# Patient Record
Sex: Male | Born: 1995 | Race: Black or African American | Hispanic: No | Marital: Single | Smoking: Never smoker
Health system: Southern US, Community
[De-identification: ages and names within clinical notes are randomized; demographics above are authoritative.]

## PROBLEM LIST (undated history)

## (undated) DIAGNOSIS — K589 Irritable bowel syndrome without diarrhea: Secondary | ICD-10-CM

## (undated) DIAGNOSIS — T7840XA Allergy, unspecified, initial encounter: Secondary | ICD-10-CM

## (undated) DIAGNOSIS — K2970 Gastritis, unspecified, without bleeding: Secondary | ICD-10-CM

## (undated) HISTORY — PX: KNEE ARTHROSCOPY WITH ANTERIOR CRUCIATE LIGAMENT (ACL) REPAIR: SHX5644

## (undated) HISTORY — DX: Allergy, unspecified, initial encounter: T78.40XA

## (undated) HISTORY — DX: Irritable bowel syndrome, unspecified: K58.9

## (undated) HISTORY — DX: Gastritis, unspecified, without bleeding: K29.70

---

## 2018-03-01 ENCOUNTER — Observation Stay (HOSPITAL_COMMUNITY): Payer: BLUE CROSS/BLUE SHIELD | Admitting: Certified Registered"

## 2018-03-01 ENCOUNTER — Encounter (HOSPITAL_COMMUNITY): Payer: Self-pay | Admitting: Emergency Medicine

## 2018-03-01 ENCOUNTER — Emergency Department (HOSPITAL_COMMUNITY): Payer: BLUE CROSS/BLUE SHIELD

## 2018-03-01 ENCOUNTER — Encounter (HOSPITAL_COMMUNITY)
Admission: EM | Disposition: A | Discharge status: Home / Self Care | Payer: Self-pay | Source: Home / Self Care | Attending: Emergency Medicine

## 2018-03-01 ENCOUNTER — Other Ambulatory Visit: Payer: Self-pay

## 2018-03-01 ENCOUNTER — Observation Stay (HOSPITAL_COMMUNITY)
Admission: EM | Admit: 2018-03-01 | Discharge: 2018-03-02 | Disposition: A | Discharge status: Home / Self Care | Payer: BLUE CROSS/BLUE SHIELD | Attending: Surgery | Admitting: Surgery

## 2018-03-01 DIAGNOSIS — K3533 Acute appendicitis with perforation and localized peritonitis, with abscess: Principal | ICD-10-CM | POA: Insufficient documentation

## 2018-03-01 DIAGNOSIS — K3580 Unspecified acute appendicitis: Secondary | ICD-10-CM | POA: Diagnosis present

## 2018-03-01 HISTORY — PX: LAPAROSCOPIC APPENDECTOMY: SHX408

## 2018-03-01 LAB — COMPREHENSIVE METABOLIC PANEL
ALBUMIN: 4.2 g/dL (ref 3.5–5.0)
ALT: 18 U/L (ref 0–44)
ANION GAP: 10 (ref 5–15)
AST: 17 U/L (ref 15–41)
Alkaline Phosphatase: 70 U/L (ref 38–126)
BUN: 6 mg/dL (ref 6–20)
CALCIUM: 9.7 mg/dL (ref 8.9–10.3)
CHLORIDE: 104 mmol/L (ref 98–111)
CO2: 27 mmol/L (ref 22–32)
Creatinine, Ser: 1.26 mg/dL — ABNORMAL HIGH (ref 0.61–1.24)
GFR calc Af Amer: >60 mL/min (ref >60)
Glucose, Bld: 113 mg/dL — ABNORMAL HIGH (ref 70–99)
POTASSIUM: 3.6 mmol/L (ref 3.5–5.1)
Sodium: 141 mmol/L (ref 135–145)
TOTAL PROTEIN: 7.2 g/dL (ref 6.5–8.1)
Total Bilirubin: 1.1 mg/dL (ref 0.3–1.2)

## 2018-03-01 LAB — CBC
HCT: 45.4 % (ref 39.0–52.0)
Hemoglobin: 14.5 g/dL (ref 13.0–17.0)
MCH: 29.1 pg (ref 26.0–34.0)
MCHC: 31.9 g/dL (ref 30.0–36.0)
MCV: 91 fL (ref 78.0–100.0)
Platelets: 308 10*3/uL (ref 150–400)
RBC: 4.99 MIL/uL (ref 4.22–5.81)
RDW: 13.1 % (ref 11.5–15.5)
WBC: 10.3 10*3/uL (ref 4.0–10.5)

## 2018-03-01 LAB — URINALYSIS, ROUTINE W REFLEX MICROSCOPIC
BILIRUBIN URINE: NEGATIVE
Glucose, UA: NEGATIVE mg/dL
Hgb urine dipstick: NEGATIVE
KETONES UR: NEGATIVE mg/dL
Leukocytes, UA: NEGATIVE
NITRITE: NEGATIVE
PROTEIN: NEGATIVE mg/dL
Specific Gravity, Urine: 1.011 (ref 1.005–1.030)
pH: 7 (ref 5.0–8.0)

## 2018-03-01 LAB — LIPASE, BLOOD: LIPASE: 40 U/L (ref 11–51)

## 2018-03-01 SURGERY — APPENDECTOMY, LAPAROSCOPIC
Anesthesia: General | Site: Abdomen

## 2018-03-01 MED ORDER — ROCURONIUM BROMIDE 50 MG/5ML IV SOSY
PREFILLED_SYRINGE | INTRAVENOUS | Status: AC
  Filled 2018-03-01: qty 5

## 2018-03-01 MED ORDER — FENTANYL CITRATE (PF) 100 MCG/2ML IJ SOLN: 25.0000 ug | INTRAMUSCULAR | Status: DC | PRN

## 2018-03-01 MED ORDER — BUPIVACAINE-EPINEPHRINE 0.5% -1:200000 IJ SOLN
INTRAMUSCULAR | Status: AC
  Filled 2018-03-01: qty 1

## 2018-03-01 MED ORDER — LIDOCAINE HCL (CARDIAC) PF 100 MG/5ML IV SOSY
PREFILLED_SYRINGE | INTRAVENOUS | Status: DC | PRN
  Administered 2018-03-01: 100 mg via INTRATRACHEAL

## 2018-03-01 MED ORDER — FENTANYL CITRATE (PF) 100 MCG/2ML IJ SOLN
50.0000 ug | Freq: Once | INTRAMUSCULAR | Status: AC
  Administered 2018-03-01: 50 ug via INTRAVENOUS
  Filled 2018-03-01: qty 2

## 2018-03-01 MED ORDER — OXYCODONE HCL 5 MG PO TABS
5.0000 mg | ORAL_TABLET | ORAL | Status: DC | PRN
  Administered 2018-03-01 – 2018-03-02 (×2): 5 mg via ORAL
  Filled 2018-03-01 (×2): qty 1

## 2018-03-01 MED ORDER — KETOROLAC TROMETHAMINE 30 MG/ML IJ SOLN: 30.0000 mg | Freq: Four times a day (QID) | INTRAMUSCULAR | Status: DC | PRN

## 2018-03-01 MED ORDER — ONDANSETRON HCL 4 MG/2ML IJ SOLN
INTRAMUSCULAR | Status: DC | PRN
  Administered 2018-03-01: 4 mg via INTRAVENOUS

## 2018-03-01 MED ORDER — DEXTROSE-NACL 5-0.9 % IV SOLN
INTRAVENOUS | Status: DC
  Administered 2018-03-01 – 2018-03-02 (×2): via INTRAVENOUS

## 2018-03-01 MED ORDER — HYDROMORPHONE HCL 1 MG/ML IJ SOLN: 0.5000 mg | INTRAMUSCULAR | Status: DC | PRN

## 2018-03-01 MED ORDER — KETOROLAC TROMETHAMINE 30 MG/ML IJ SOLN
INTRAMUSCULAR | Status: AC
  Filled 2018-03-01: qty 1

## 2018-03-01 MED ORDER — SUFENTANIL CITRATE 50 MCG/ML IV SOLN
INTRAVENOUS | Status: DC | PRN
  Administered 2018-03-01 (×2): 10 ug via INTRAVENOUS

## 2018-03-01 MED ORDER — GLYCOPYRROLATE PF 0.2 MG/ML IJ SOSY
PREFILLED_SYRINGE | INTRAMUSCULAR | Status: AC
  Filled 2018-03-01: qty 1

## 2018-03-01 MED ORDER — SUFENTANIL CITRATE 50 MCG/ML IV SOLN
INTRAVENOUS | Status: AC
  Filled 2018-03-01: qty 1

## 2018-03-01 MED ORDER — DEXAMETHASONE SODIUM PHOSPHATE 10 MG/ML IJ SOLN
INTRAMUSCULAR | Status: DC | PRN
  Administered 2018-03-01: 10 mg via INTRAVENOUS

## 2018-03-01 MED ORDER — ONDANSETRON HCL 4 MG/2ML IJ SOLN
INTRAMUSCULAR | Status: AC
  Filled 2018-03-01: qty 2

## 2018-03-01 MED ORDER — ACETAMINOPHEN 10 MG/ML IV SOLN: 1000.0000 mg | Freq: Once | INTRAVENOUS | Status: DC | PRN

## 2018-03-01 MED ORDER — SODIUM CHLORIDE 0.9 % IR SOLN
Status: DC | PRN
  Administered 2018-03-01: 1000 mL

## 2018-03-01 MED ORDER — CIPROFLOXACIN IN D5W 400 MG/200ML IV SOLN
400.0000 mg | Freq: Once | INTRAVENOUS | Status: AC
  Administered 2018-03-01: 400 mg via INTRAVENOUS
  Filled 2018-03-01: qty 200

## 2018-03-01 MED ORDER — OXYCODONE HCL 5 MG PO TABS: 5.0000 mg | ORAL_TABLET | Freq: Once | ORAL | Status: DC | PRN

## 2018-03-01 MED ORDER — BUPIVACAINE-EPINEPHRINE 0.5% -1:200000 IJ SOLN
INTRAMUSCULAR | Status: DC | PRN
  Administered 2018-03-01: 20 mL

## 2018-03-01 MED ORDER — METRONIDAZOLE IN NACL 5-0.79 MG/ML-% IV SOLN
500.0000 mg | Freq: Once | INTRAVENOUS | Status: AC
  Administered 2018-03-01: 500 mg via INTRAVENOUS

## 2018-03-01 MED ORDER — DEXAMETHASONE SODIUM PHOSPHATE 10 MG/ML IJ SOLN
INTRAMUSCULAR | Status: AC
  Filled 2018-03-01: qty 1

## 2018-03-01 MED ORDER — OXYCODONE HCL 5 MG/5ML PO SOLN: 5.0000 mg | Freq: Once | ORAL | Status: DC | PRN

## 2018-03-01 MED ORDER — GLYCOPYRROLATE 0.2 MG/ML IJ SOLN
INTRAMUSCULAR | Status: DC | PRN
  Administered 2018-03-01: 0.2 mg via INTRAVENOUS

## 2018-03-01 MED ORDER — MIDAZOLAM HCL 2 MG/2ML IJ SOLN
INTRAMUSCULAR | Status: AC
  Filled 2018-03-01: qty 2

## 2018-03-01 MED ORDER — SUGAMMADEX SODIUM 200 MG/2ML IV SOLN
INTRAVENOUS | Status: DC | PRN
  Administered 2018-03-01: 200 mg via INTRAVENOUS

## 2018-03-01 MED ORDER — DEXTROSE-NACL 5-0.9 % IV SOLN: INTRAVENOUS | Status: DC

## 2018-03-01 MED ORDER — KETOROLAC TROMETHAMINE 30 MG/ML IJ SOLN
INTRAMUSCULAR | Status: DC | PRN
  Administered 2018-03-01: 30 mg via INTRAVENOUS

## 2018-03-01 MED ORDER — ARTIFICIAL TEARS OPHTHALMIC OINT
TOPICAL_OINTMENT | OPHTHALMIC | Status: AC
  Filled 2018-03-01: qty 3.5

## 2018-03-01 MED ORDER — MIDAZOLAM HCL 5 MG/5ML IJ SOLN
INTRAMUSCULAR | Status: DC | PRN
  Administered 2018-03-01: 2 mg via INTRAVENOUS

## 2018-03-01 MED ORDER — PROPOFOL 10 MG/ML IV BOLUS
INTRAVENOUS | Status: AC
  Filled 2018-03-01: qty 20

## 2018-03-01 MED ORDER — 0.9 % SODIUM CHLORIDE (POUR BTL) OPTIME
TOPICAL | Status: DC | PRN
  Administered 2018-03-01: 1000 mL

## 2018-03-01 MED ORDER — ROCURONIUM BROMIDE 100 MG/10ML IV SOLN
INTRAVENOUS | Status: DC | PRN
  Administered 2018-03-01: 50 mg via INTRAVENOUS

## 2018-03-01 MED ORDER — ACETAMINOPHEN 160 MG/5ML PO SOLN: 1000.0000 mg | Freq: Once | ORAL | Status: DC | PRN

## 2018-03-01 MED ORDER — PROPOFOL 10 MG/ML IV BOLUS
INTRAVENOUS | Status: DC | PRN
  Administered 2018-03-01: 200 mg via INTRAVENOUS

## 2018-03-01 MED ORDER — LACTATED RINGERS IV SOLN
INTRAVENOUS | Status: DC | PRN
  Administered 2018-03-01: 09:00:00 via INTRAVENOUS

## 2018-03-01 MED ORDER — SODIUM CHLORIDE 0.9 % IJ SOLN
INTRAMUSCULAR | Status: AC
  Filled 2018-03-01: qty 10

## 2018-03-01 MED ORDER — IOHEXOL 300 MG/ML  SOLN
100.0000 mL | Freq: Once | INTRAMUSCULAR | Status: AC | PRN
  Administered 2018-03-01: 100 mL via INTRAVENOUS

## 2018-03-01 MED ORDER — ACETAMINOPHEN 500 MG PO TABS: 1000.0000 mg | ORAL_TABLET | Freq: Once | ORAL | Status: DC | PRN

## 2018-03-01 MED ORDER — HYDROMORPHONE HCL 1 MG/ML IJ SOLN: 1.0000 mg | INTRAMUSCULAR | Status: DC | PRN

## 2018-03-01 MED ORDER — METRONIDAZOLE IN NACL 5-0.79 MG/ML-% IV SOLN
500.0000 mg | Freq: Once | INTRAVENOUS | Status: DC
  Filled 2018-03-01: qty 100

## 2018-03-01 SURGICAL SUPPLY — 39 items
APPLIER CLIP 5 13 M/L LIGAMAX5 (MISCELLANEOUS)
APPLIER CLIP ROT 10 11.4 M/L (STAPLE)
CANISTER SUCT 3000ML PPV (MISCELLANEOUS) ×3 IMPLANT
CHLORAPREP W/TINT 26ML (MISCELLANEOUS) ×3 IMPLANT
CLIP APPLIE 5 13 M/L LIGAMAX5 (MISCELLANEOUS) IMPLANT
CLIP APPLIE ROT 10 11.4 M/L (STAPLE) IMPLANT
COVER SURGICAL LIGHT HANDLE (MISCELLANEOUS) ×3 IMPLANT
COVER WAND RF STERILE (DRAPES) ×3 IMPLANT
CUTTER FLEX LINEAR 45M (STAPLE) ×3 IMPLANT
DERMABOND ADHESIVE PROPEN (GAUZE/BANDAGES/DRESSINGS) ×2
DERMABOND ADVANCED (GAUZE/BANDAGES/DRESSINGS) ×2
DERMABOND ADVANCED .7 DNX12 (GAUZE/BANDAGES/DRESSINGS) ×1 IMPLANT
DERMABOND ADVANCED .7 DNX6 (GAUZE/BANDAGES/DRESSINGS) ×1 IMPLANT
ELECT REM PT RETURN 9FT ADLT (ELECTROSURGICAL) ×3
ELECTRODE REM PT RTRN 9FT ADLT (ELECTROSURGICAL) ×1 IMPLANT
GLOVE SURG SIGNA 7.5 PF LTX (GLOVE) ×3 IMPLANT
GOWN STRL REUS W/ TWL LRG LVL3 (GOWN DISPOSABLE) ×2 IMPLANT
GOWN STRL REUS W/ TWL XL LVL3 (GOWN DISPOSABLE) ×1 IMPLANT
GOWN STRL REUS W/TWL LRG LVL3 (GOWN DISPOSABLE) ×4
GOWN STRL REUS W/TWL XL LVL3 (GOWN DISPOSABLE) ×2
KIT BASIN OR (CUSTOM PROCEDURE TRAY) ×3 IMPLANT
KIT TURNOVER KIT B (KITS) ×3 IMPLANT
NS IRRIG 1000ML POUR BTL (IV SOLUTION) ×3 IMPLANT
PAD ARMBOARD 7.5X6 YLW CONV (MISCELLANEOUS) ×6 IMPLANT
POUCH SPECIMEN RETRIEVAL 10MM (ENDOMECHANICALS) ×3 IMPLANT
RELOAD 45 VASCULAR/THIN (ENDOMECHANICALS) IMPLANT
RELOAD STAPLE TA45 3.5 REG BLU (ENDOMECHANICALS) ×3 IMPLANT
SET IRRIG TUBING LAPAROSCOPIC (IRRIGATION / IRRIGATOR) ×3 IMPLANT
SHEARS HARMONIC ACE PLUS 36CM (ENDOMECHANICALS) ×3 IMPLANT
SLEEVE ENDOPATH XCEL 5M (ENDOMECHANICALS) ×3 IMPLANT
SPECIMEN JAR SMALL (MISCELLANEOUS) ×3 IMPLANT
SUT MON AB 4-0 PC3 18 (SUTURE) ×3 IMPLANT
TOWEL OR 17X24 6PK STRL BLUE (TOWEL DISPOSABLE) ×3 IMPLANT
TOWEL OR 17X26 10 PK STRL BLUE (TOWEL DISPOSABLE) ×3 IMPLANT
TRAY LAPAROSCOPIC MC (CUSTOM PROCEDURE TRAY) ×3 IMPLANT
TROCAR XCEL BLUNT TIP 100MML (ENDOMECHANICALS) ×3 IMPLANT
TROCAR XCEL NON-BLD 5MMX100MML (ENDOMECHANICALS) ×3 IMPLANT
TUBING INSUFFLATION (TUBING) ×3 IMPLANT
WATER STERILE IRR 1000ML POUR (IV SOLUTION) ×3 IMPLANT

## 2018-03-01 NOTE — H&P (Signed)
Chad Akin Yi. is an 22 y.o. male.   Chief Complaint: Abdominal pain HPI: Patient is a 22 year old male who states he began having abdominal pain yesterday morning.  He states that the pain was generalized, periumbilical.  He states that since that time it is localized to the right lower quadrant.  Describes the pain is sharp.  Patient denies any nausea or vomiting or fevers while at home.  Secondary to continued pain he proceeded to the ER.  Upon evaluation the ER he underwent CT scan.  I did review the scan personally.  This did reveal acute appendicitis without perforation.  General surgery was consulted for further evaluation and management.  History reviewed. No pertinent past medical history.  Past Surgical History:  Procedure Laterality Date  . KNEE ARTHROSCOPY WITH ANTERIOR CRUCIATE LIGAMENT (ACL) REPAIR Left     No family history on file. Social History:  reports that he has never smoked. He does not have any smokeless tobacco history on file. He reports that he drinks alcohol. He reports that he does not use drugs.  Allergies:  Allergies  Allergen Reactions  . Amoxicillin Hives     (Not in a hospital admission)  Results for orders placed or performed during the hospital encounter of 03/01/18 (from the past 48 hour(s))  Urinalysis, Routine w reflex microscopic     Status: None   Collection Time: 03/01/18  3:34 AM  Result Value Ref Range   Color, Urine YELLOW YELLOW   APPearance CLEAR CLEAR   Specific Gravity, Urine 1.011 1.005 - 1.030   pH 7.0 5.0 - 8.0   Glucose, UA NEGATIVE NEGATIVE mg/dL   Hgb urine dipstick NEGATIVE NEGATIVE   Bilirubin Urine NEGATIVE NEGATIVE   Ketones, ur NEGATIVE NEGATIVE mg/dL   Protein, ur NEGATIVE NEGATIVE mg/dL   Nitrite NEGATIVE NEGATIVE   Leukocytes, UA NEGATIVE NEGATIVE    Comment: Performed at [facility] [address], [place], [place] [address]  Lipase, blood     Status: None   Collection Time: 03/01/18  3:48 AM   Result Value Ref Range   Lipase 40 11 - 51 U/L    Comment: Performed at [facility], [place] [address], [place],  [address]  Comprehensive metabolic panel     Status: Abnormal   Collection Time: 03/01/18  3:48 AM  Result Value Ref Range   Sodium 141 135 - 145 mmol/L   Potassium 3.6 3.5 - 5.1 mmol/L   Chloride 104 98 - 111 mmol/L   CO2 27 22 - 32 mmol/L   Glucose, Bld 113 (H) 70 - 99 mg/dL   BUN 6 6 - 20 mg/dL   Creatinine, Ser 1.26 (H) 0.61 - 1.24 mg/dL   Calcium 9.7 8.9 - 10.3 mg/dL   Total Protein 7.2 6.5 - 8.1 g/dL   Albumin 4.2 3.5 - 5.0 g/dL   AST 17 15 - 41 U/L   ALT 18 0 - 44 U/L   Alkaline Phosphatase 70 38 - 126 U/L   Total Bilirubin 1.1 0.3 - 1.2 mg/dL   GFR calc non Af Amer >60 >60 mL/min   GFR calc Af Amer >60 >60 mL/min    Comment: (NOTE) The eGFR has been calculated using the CKD EPI equation. This calculation has not been validated in all clinical situations. eGFR's persistently <60 mL/min signify possible Chronic Kidney Disease.    Anion gap 10 5 - 15    Comment: Performed at [facility], [address] [place], [place] [address]  CBC     Status: None   Collection Time: 03/01/18  3:48 AM  Result Value Ref Range   WBC 10.3 4.0 - 10.5 K/uL   RBC 4.99 4.22 - 5.81 MIL/uL   Hemoglobin 14.5 13.0 - 17.0 g/dL   HCT 45.4 39.0 - 52.0 %   MCV 91.0 78.0 - 100.0 fL   MCH 29.1 26.0 - 34.0 pg   MCHC 31.9 30.0 - 36.0 g/dL   RDW 13.1 11.5 - 15.5 %   Platelets 308 150 - 400 K/uL    Comment: Performed at [facility] [address], [place], [place] [address]   Ct Abdomen Pelvis W Contrast  Result Date: 03/01/2018 CLINICAL DATA:  Abdominal distention and right lower quadrant pain. EXAM: CT ABDOMEN AND PELVIS WITH CONTRAST TECHNIQUE: Multidetector CT imaging of the abdomen and pelvis was performed using the standard protocol following bolus administration of intravenous contrast. CONTRAST:  112m OMNIPAQUE IOHEXOL 300 MG/ML  SOLN COMPARISON:   None. FINDINGS: Lower chest: Minor atelectasis the lung bases. Hepatobiliary: Focal fatty infiltration adjacent to the falciform ligament. No suspicious hepatic lesion. Gallbladder physiologically distended, no calcified stone. No biliary dilatation. Pancreas: No ductal dilatation or inflammation. Spleen: Normal in size without focal abnormality. Adrenals/Urinary Tract: Normal adrenal glands. No hydronephrosis or perinephric edema. Homogeneous renal enhancement. Urinary bladder is partially distended without wall thickening. Stomach/Bowel: Dilated fluid-filled appendix with periappendiceal stranding as described below. Stomach distended with ingested contents. No small bowel dilatation or inflammation. No bowel obstruction. Moderate stool in the ascending, transverse, and descending colon, more distal colon is decompressed. Appendix: Location: Retrocecal Diameter: 14 mm Appendicolith: Yes at the base. Mucosal hyper-enhancement: Yes Extraluminal gas: No Periappendiceal collection: Small amount of free fluid but no well-defined fluid collection. Vascular/Lymphatic: Prominent pericecal lymph nodes are reactive. No acute vascular findings. Reproductive: Prostate is unremarkable. Other: Small amount of free fluid tracks into the pelvis. No free air or intra-abdominal abscess. Tiny fat containing umbilical hernia. Musculoskeletal: There are no acute or suspicious osseous abnormalities. IMPRESSION: Uncomplicated acute appendicitis. Electronically Signed   By: MKeith RakeM.D.   On: 03/01/2018 05:57    Review of Systems  Constitutional: Negative for chills, fever and malaise/fatigue.  HENT: Negative for ear discharge, hearing loss and sore throat.   Eyes: Negative for blurred vision and discharge.  Respiratory: Negative for cough and shortness of breath.   Cardiovascular: Negative for chest pain, orthopnea and leg swelling.  Gastrointestinal: Positive for abdominal pain. Negative for constipation, diarrhea,  heartburn, nausea and vomiting.  Musculoskeletal: Negative for myalgias and neck pain.  Skin: Negative for itching and rash.  Neurological: Negative for dizziness, focal weakness, seizures and loss of consciousness.  Endo/Heme/Allergies: Negative for environmental allergies. Does not bruise/bleed easily.  Psychiatric/Behavioral: Negative for depression and suicidal ideas.  All other systems reviewed and are negative.   Blood pressure 102/63, pulse 71, temperature 98.2 F (36.8 C), temperature source Oral, resp. rate 16, SpO2 99 %. Physical Exam  Constitutional: He is oriented to person, place, and time. Vital signs are normal. He appears well-developed and well-nourished.  Conversant No acute distress  Eyes: Lids are normal. No scleral icterus.  No lid lag Moist conjunctiva  Neck: No tracheal tenderness present. No thyromegaly present.  No cervical lymphadenopathy  Cardiovascular: Normal rate, regular rhythm and intact distal pulses.  No murmur heard. Respiratory: Effort normal and breath sounds normal. He has no wheezes. He has no rales.  GI: Soft.  Bowel sounds are normal. There is no hepatosplenomegaly. There is tenderness. There is tenderness at McBurney's point. No hernia.  Neurological: He is alert and oriented to person, place, and time.  Normal gait and station  Skin: Skin is warm. No rash noted. No cyanosis. Nails show no clubbing.  Normal skin turgor  Psychiatric: Judgment normal.  Appropriate affect     Assessment/Plan 22 year old male with acute appendicitis.  1.  We will proceed to the operating for laparoscopic appendectomy by Dr. Ninfa Linden 2. I discussed with the patient the risks benefits of the procedure to include but not limited to: Infection, bleeding, damage to surrounding structures, possible ileus, possible postoperative infection. Patient voiced understanding and wishes to proceed.   Ralene Ok, MD 03/01/2018, 6:51 AM

## 2018-03-01 NOTE — Op Note (Signed)
Appendectomy, Lap, Procedure Note  Indications: The patient presented with a history of right-sided abdominal pain. A CT revealed findings consistent with acute appendicitis.  Pre-operative Diagnosis: acute appendicitis  Post-operative Diagnosis: Same  Surgeon: Abigail Miyamoto A   Assistants: 0  Anesthesia: General endotracheal anesthesia  ASA Class: 1  Procedure Details  The patient was seen again in the Holding Room. The risks, benefits, complications, treatment options, and expected outcomes were discussed with the patient and/or family. The possibilities of reaction to medication, perforation of viscus, bleeding, recurrent infection, finding a normal appendix, the need for additional procedures, failure to diagnose a condition, and creating a complication requiring transfusion or operation were discussed. There was concurrence with the proposed plan and informed consent was obtained. The site of surgery was properly noted. The patient was taken to Operating Room, identified as Chad Willis. and the procedure verified as Appendectomy. A Time Out was held and the above information confirmed.  The patient was placed in the supine position and general anesthesia was induced, along with placement of orogastric tube, Venodyne boots, and a Foley catheter. The abdomen was prepped and draped in a sterile fashion. A one centimeter infraumbilical incision was made.  The midline fascia was incised with a scalpel.  A Kelly clamp was used to confirm entrance into the peritoneal cavity.  A pursestring suture was passed around the incision with a 0 Vicryl.  The Hasson was introduced into the abdomen and the tails of the suture were used to hold the Hasson in place.   The pneumoperitoneum was then established to steady pressure of 15 mmHg.  Additional 5 mm cannulas then placed in the left lower quadrant of the abdomen and the right upper quadrant under direct visualization. A careful evaluation  of the entire abdomen was carried out. The patient was placed in Trendelenburg and left lateral decubitus position. The small intestines were retracted in the cephalad and left lateral direction away from the pelvis and right lower quadrant. The patient was found to have an enlarged and inflamed appendix that was extending into the pelvis. There was no evidence of perforation.  The appendix was carefully dissected. The appendix was was skeletonized with the harmonic scalpel.   The appendix was divided at its base using an endo-GIA stapler. Minimal appendiceal stump was left in place. There was no evidence of bleeding, leakage, or complication after division of the appendix. Irrigation was also performed and irrigate suctioned from the abdomen as well.  The umbilical port site was closed with the purse string suture. There was no residual palpable fascial defect.  The trocar site skin wounds were closed with 4-0 Monocryl.  Instrument, sponge, and needle counts were correct at the conclusion of the case.   Findings: The appendix was found to be inflamed. There were not signs of necrosis.  There was not perforation. There was not abscess formation.  Estimated Blood Loss:  Minimal         Drains:none         Complications:  None; patient tolerated the procedure well.         Disposition: PACU - hemodynamically stable.         Condition: stable

## 2018-03-01 NOTE — ED Provider Notes (Signed)
[facility] EMERGENCY DEPARTMENT Provider Note   CSN: [id number] Arrival date & time: 03/01/18  0325     History   Chief Complaint Chief Complaint  Patient presents with  . Abdominal Pain    HPI Chad Willis. is a 22 y.o. male with a hx of her medical problems presents to the Emergency Department complaining of gradual, persistent, progressively worsening periumbilical, now right lower quadrant abdominal pain onset this morning and significantly worsening throughout the day.  Patient reports he is had several days of loose stools and yesterday had a small amount of abdominal discomfort but no significant or focal pain.  Patient reports nausea without vomiting.  No treatments prior to arrival.  No history of abdominal surgeries.  No specific aggravating or alleviating factors.  No international travel or known sick contacts.  No melena or hematochezia.  Patient denies fever, chills, headache, neck pain, chest pain, shortness of breath, weakness, dizziness, syncope.  Patient denies dysuria, testicular pain, hematuria or penile discharge.  The history is provided by the patient and medical records. No language interpreter was used.    History reviewed. No pertinent past medical history.  There are no active problems to display for this patient.   Past Surgical History:  Procedure Laterality Date  . KNEE ARTHROSCOPY WITH ANTERIOR CRUCIATE LIGAMENT (ACL) REPAIR Left         Home Medications    Prior to Admission medications   Medication Sig Start Date End Date Taking? Authorizing Provider  fexofenadine (ALLEGRA) 180 MG tablet Take 180 mg by mouth daily as needed for allergies or rhinitis.   Yes [provider]    Family History No family history on file.  Social History Social History   Tobacco Use  . Smoking status: Never Smoker  Substance Use Topics  . Alcohol use: Yes    Comment: occasional  . Drug use: Never     Allergies     Amoxicillin   Review of Systems Review of Systems  Constitutional: Negative for appetite change, diaphoresis, fatigue, fever and unexpected weight change.  HENT: Negative for mouth sores.   Eyes: Negative for visual disturbance.  Respiratory: Negative for cough, chest tightness, shortness of breath and wheezing.   Cardiovascular: Negative for chest pain.  Gastrointestinal: Positive for abdominal pain and diarrhea ( Loose stools). Negative for constipation, nausea and vomiting.  Endocrine: Negative for polydipsia, polyphagia and polyuria.  Genitourinary: Negative for dysuria, frequency, hematuria and urgency.  Musculoskeletal: Negative for back pain and neck stiffness.  Skin: Negative for rash.  Allergic/Immunologic: Negative for immunocompromised state.  Neurological: Negative for syncope, light-headedness and headaches.  Hematological: Does not bruise/bleed easily.  Psychiatric/Behavioral: Negative for sleep disturbance. The patient is not nervous/anxious.      Physical Exam Updated Vital Signs BP 102/63   Pulse 71   Temp 98.2 F (36.8 C) (Oral)   Resp 16   SpO2 99%   Physical Exam  Constitutional: He appears well-developed and well-nourished. No distress.  Awake, alert, nontoxic appearance Uncomfortable appearing  HENT:  Head: Normocephalic and atraumatic.  Mouth/Throat: Oropharynx is clear and moist. No oropharyngeal exudate.  Eyes: Conjunctivae are normal. No scleral icterus.  Neck: Normal range of motion. Neck supple.  Cardiovascular: Normal rate, regular rhythm and intact distal pulses.  Pulmonary/Chest: Effort normal and breath sounds normal. No respiratory distress. He has no wheezes.  Equal chest expansion  Abdominal: Soft. Bowel sounds are normal. He exhibits no mass. There is tenderness in the  right lower quadrant. There is guarding and positive Murphy's sign. There is no rigidity, no rebound and no CVA tenderness.  Musculoskeletal: Normal range of motion. He  exhibits no edema.  Neurological: He is alert.  Speech is clear and goal oriented Moves extremities without ataxia  Skin: Skin is warm and dry. He is not diaphoretic.  Psychiatric: He has a normal mood and affect.  Nursing note and vitals reviewed.    ED Treatments / Results  Labs (all labs ordered are listed, but only abnormal results are displayed) Labs Reviewed  COMPREHENSIVE METABOLIC PANEL - Abnormal; Notable for the following components:      Result Value   Glucose, Bld 113 (*)    Creatinine, Ser 1.26 (*)    All other components within normal limits  LIPASE, BLOOD  CBC  URINALYSIS, ROUTINE W REFLEX MICROSCOPIC     Radiology Ct Abdomen Pelvis W Contrast  Result Date: 03/01/2018 CLINICAL DATA:  Abdominal distention and right lower quadrant pain. EXAM: CT ABDOMEN AND PELVIS WITH CONTRAST TECHNIQUE: Multidetector CT imaging of the abdomen and pelvis was performed using the standard protocol following bolus administration of intravenous contrast. CONTRAST:  OMNIPAQUE IOHEXOL 300 MG/ML  SOLN COMPARISON:  None. FINDINGS: Lower chest: Minor atelectasis the lung bases. Hepatobiliary: Focal fatty infiltration adjacent to the falciform ligament. No suspicious hepatic lesion. Gallbladder physiologically distended, no calcified stone. No biliary dilatation. Pancreas: No ductal dilatation or inflammation. Spleen: Normal in size without focal abnormality. Adrenals/Urinary Tract: Normal adrenal glands. No hydronephrosis or perinephric edema. Homogeneous renal enhancement. Urinary bladder is partially distended without wall thickening. Stomach/Bowel: Dilated fluid-filled appendix with periappendiceal stranding as described below. Stomach distended with ingested contents. No small bowel dilatation or inflammation. No bowel obstruction. Moderate stool in the ascending, transverse, and descending colon, more distal colon is decompressed. Appendix: Location: Retrocecal Diameter: 14 mm  Appendicolith: Yes at the base. Mucosal hyper-enhancement: Yes Extraluminal gas: No Periappendiceal collection: Small amount of free fluid but no well-defined fluid collection. Vascular/Lymphatic: Prominent pericecal lymph nodes are reactive. No acute vascular findings. Reproductive: Prostate is unremarkable. Other: Small amount of free fluid tracks into the pelvis. No free air or intra-abdominal abscess. Tiny fat containing umbilical hernia. Musculoskeletal: There are no acute or suspicious osseous abnormalities. IMPRESSION: Uncomplicated acute appendicitis. Electronically Signed   By: Narda Rutherford M.D.   On: 03/01/2018 05:57    Procedures Procedures (including critical care time)  Medications Ordered in ED Medications  ciprofloxacin (CIPRO) IVPB 400 mg (400 mg Intravenous New Bag/Given 03/01/18 0624)    And  metroNIDAZOLE (FLAGYL) IVPB 500 mg (has no administration in time range)  fentaNYL (SUBLIMAZE) injection 50 mcg (50 mcg Intravenous Given 03/01/18 0516)  iohexol (OMNIPAQUE) 300 MG/ML solution 100 mL (100 mLs Intravenous Contrast Given 03/01/18 0528)     Initial Impression / Assessment and Plan / ED Course  I have reviewed the triage vital signs and the nursing notes.  Pertinent labs & imaging results that were available during my care of the patient were reviewed by me and considered in my medical decision making (see chart for details).     Presents with loose stools, anorexia and right lower quadrant abdominal pain.  Guarding on exam.  Labs doubt leukocytosis.  Slightly elevated serum creatinine 1.26.  Fluids given.  No fever, tachycardia or hypotension.  No evidence of sepsis.  CT scan shows acute appendicitis.  I personally evaluated these images. Antibiotics given.  Patient does have a penicillin allergy.  Discussed with Dr. Derrell Lolling  who will admit.    Final Clinical Impressions(s) / ED Diagnoses   Final diagnoses:  Acute appendicitis, unspecified acute appendicitis type     ED Discharge Orders    None       Mardene Sayer Boyd Kerbs 03/01/18 0636    Dione Booze, MD 03/01/18 [phone]

## 2018-03-01 NOTE — Plan of Care (Signed)
  Problem: Clinical Measurements: Goal: Respiratory complications will improve Outcome: Progressing Goal: Cardiovascular complication will be avoided Outcome: Progressing   Problem: Activity: Goal: Risk for activity intolerance will decrease Outcome: Progressing   Problem: Nutrition: Goal: Adequate nutrition will be maintained Outcome: Progressing   Problem: Coping: Goal: Level of anxiety will decrease Outcome: Progressing   Problem: Elimination: Goal: Will not experience complications related to bowel motility Outcome: Progressing Goal: Will not experience complications related to urinary retention Outcome: Progressing   Problem: Pain Managment: Goal: General experience of comfort will improve Outcome: Progressing   Problem: Safety: Goal: Ability to remain free from injury will improve Outcome: Progressing   Problem: Skin Integrity: Goal: Risk for impaired skin integrity will decrease Outcome: Progressing   

## 2018-03-01 NOTE — ED Notes (Signed)
Surgeon at bedside.  

## 2018-03-01 NOTE — ED Triage Notes (Addendum)
Pt reports he typically has 3 BMs a day, states he had 2 yesterday and the day before and today feels constipated and has RLQ pain. Reports that he had diarrhea for the past 3 days prior to yesterday. Denies any vomiting. Denies blood in stools. Took some pepto bismol earlier that helped some

## 2018-03-01 NOTE — Anesthesia Preprocedure Evaluation (Signed)
Anesthesia Evaluation  Patient identified by MRN, date of birth, ID band Patient awake    Reviewed: Allergy & Precautions, NPO status , Patient's Chart, lab work & pertinent test results  History of Anesthesia Complications Negative for: history of anesthetic complications  Airway Mallampati: II  TM Distance: >3 FB Neck ROM: Full    Dental  (+) Teeth Intact, Chipped,    Pulmonary neg pulmonary ROS,    breath sounds clear to auscultation       Cardiovascular negative cardio ROS   Rhythm:Regular     Neuro/Psych negative neurological ROS  negative psych ROS   GI/Hepatic Neg liver ROS, Acute appendicitis    Endo/Other  negative endocrine ROS  Renal/GU negative Renal ROS     Musculoskeletal negative musculoskeletal ROS (+)   Abdominal   Peds  Hematology negative hematology ROS (+)   Anesthesia Other Findings   Reproductive/Obstetrics                             Anesthesia Physical Anesthesia Plan  ASA: I  Anesthesia Plan: General   Post-op Pain Management:    Induction: Intravenous and Rapid sequence  PONV Risk Score and Plan: 2 and Ondansetron and Dexamethasone  Airway Management Planned: Oral ETT  Additional Equipment:   Intra-op Plan:   Post-operative Plan: Extubation in OR  Informed Consent: I have reviewed the patients History and Physical, chart, labs and discussed the procedure including the risks, benefits and alternatives for the proposed anesthesia with the patient or authorized representative who has indicated his/her understanding and acceptance.   Dental advisory given  Plan Discussed with: CRNA and Surgeon  Anesthesia Plan Comments:         Anesthesia Quick Evaluation

## 2018-03-01 NOTE — Transfer of Care (Signed)
Immediate Anesthesia Transfer of Care Note  Patient: Chad Willis.  Procedure(s) Performed: APPENDECTOMY LAPAROSCOPIC (N/A Abdomen)  Patient Location: PACU  Anesthesia Type:General  Level of Consciousness: oriented, sedated, drowsy, patient cooperative and responds to stimulation  Airway & Oxygen Therapy: Patient Spontanous Breathing and Patient connected to nasal cannula oxygen  Post-op Assessment: Report given to RN, Post -op Vital signs reviewed and stable and Patient moving all extremities X 4  Post vital signs: Reviewed and stable  Last Vitals:  Vitals Value Taken Time  BP 118/75 03/01/2018 10:05 AM  Temp    Pulse 67 03/01/2018 10:06 AM  Resp 12 03/01/2018 10:06 AM  SpO2 98 % 03/01/2018 10:06 AM  Vitals shown include unvalidated device data.  Last Pain:  Vitals:   03/01/18 0800  TempSrc: Oral  PainSc:          Complications: No apparent anesthesia complications

## 2018-03-01 NOTE — Anesthesia Procedure Notes (Signed)
Procedure Name: Intubation Date/Time: 03/01/2018 9:24 AM Performed by: Claris Che, CRNA Pre-anesthesia Checklist: Patient identified, Emergency Drugs available, Suction available, Patient being monitored and Timeout performed Patient Re-evaluated:Patient Re-evaluated prior to induction Oxygen Delivery Method: Circle system utilized Preoxygenation: Pre-oxygenation with 100% oxygen Induction Type: IV induction Ventilation: Mask ventilation without difficulty Laryngoscope Size: Mac and 4 Grade View: Grade I Tube type: Oral Tube size: 7.5 mm Number of attempts: 1 Airway Equipment and Method: Video-laryngoscopy and Stylet Placement Confirmation: ETT inserted through vocal cords under direct vision,  positive ETCO2 and breath sounds checked- equal and bilateral Secured at: 23 cm Tube secured with: Tape Dental Injury: Teeth and Oropharynx as per pre-operative assessment

## 2018-03-01 NOTE — Progress Notes (Signed)
Patient ID: [name]., male   DOB: Apr 17, 1996, 22 y.o.   MRN: [id number]   Plan laparoscopic appendectomy for appendicitis today  I discussed the procedure and the risks with the patient.  These risks include but are not limited to bleeding, infection, stump leak, injury to surrounding structures, cardiopulmonary issues, DVT, etc.  He agrees to proceed with surgery which is scheduled.

## 2018-03-02 ENCOUNTER — Encounter (HOSPITAL_COMMUNITY): Payer: Self-pay | Admitting: Surgery

## 2018-03-02 MED ORDER — OXYCODONE HCL 5 MG PO TABS: 5.0000 mg | ORAL_TABLET | Freq: Four times a day (QID) | ORAL | 0 refills | Status: DC | PRN

## 2018-03-02 NOTE — Progress Notes (Signed)
Pt discharged home Left ambulatory

## 2018-03-02 NOTE — Progress Notes (Signed)
Pt discharge home with his mother

## 2018-03-02 NOTE — Anesthesia Postprocedure Evaluation (Signed)
Anesthesia Post Note  Patient: Chad Willis.  Procedure(s) Performed: APPENDECTOMY LAPAROSCOPIC (N/A Abdomen)     Patient location during evaluation: PACU Anesthesia Type: General Level of consciousness: awake and alert Pain management: pain level controlled Vital Signs Assessment: post-procedure vital signs reviewed and stable Respiratory status: spontaneous breathing, nonlabored ventilation, respiratory function stable and patient connected to nasal cannula oxygen Cardiovascular status: blood pressure returned to baseline and stable Postop Assessment: no apparent nausea or vomiting Anesthetic complications: no    Last Vitals:  Vitals:   03/01/18 2015 03/02/18 0536  BP: 125/67 119/67  Pulse: 67 65  Resp: 18 18  Temp: 36.7 C 36.5 C  SpO2: 100% 98%    Last Pain:  Vitals:   03/02/18 0636  TempSrc:   PainSc: 1                  Payal Stanforth

## 2018-03-02 NOTE — Discharge Instructions (Signed)

## 2018-03-02 NOTE — Discharge Summary (Signed)
[facility]/Trauma Discharge Summary   Patient ID: Chad Willis. MRN: [id number] DOB/AGE: 08-28-1995 22 y.o.  Admit date: 03/01/2018 Discharge date: 03/02/2018  Admitting Diagnosis: appendicitis  Discharge Diagnosis Patient Active Problem List   Diagnosis Date Noted  . Acute appendicitis 03/01/2018    Consultants none  Imaging: Ct Abdomen Pelvis W Contrast  Result Date: 03/01/2018 CLINICAL DATA:  Abdominal distention and right lower quadrant pain. EXAM: CT ABDOMEN AND PELVIS WITH CONTRAST TECHNIQUE: Multidetector CT imaging of the abdomen and pelvis was performed using the standard protocol following bolus administration of intravenous contrast. CONTRAST:  OMNIPAQUE IOHEXOL 300 MG/ML  SOLN COMPARISON:  None. FINDINGS: Lower chest: Minor atelectasis the lung bases. Hepatobiliary: Focal fatty infiltration adjacent to the falciform ligament. No suspicious hepatic lesion. Gallbladder physiologically distended, no calcified stone. No biliary dilatation. Pancreas: No ductal dilatation or inflammation. Spleen: Normal in size without focal abnormality. Adrenals/Urinary Tract: Normal adrenal glands. No hydronephrosis or perinephric edema. Homogeneous renal enhancement. Urinary bladder is partially distended without wall thickening. Stomach/Bowel: Dilated fluid-filled appendix with periappendiceal stranding as described below. Stomach distended with ingested contents. No small bowel dilatation or inflammation. No bowel obstruction. Moderate stool in the ascending, transverse, and descending colon, more distal colon is decompressed. Appendix: Location: Retrocecal Diameter: 14 mm Appendicolith: Yes at the base. Mucosal hyper-enhancement: Yes Extraluminal gas: No Periappendiceal collection: Small amount of free fluid but no well-defined fluid collection. Vascular/Lymphatic: Prominent pericecal lymph nodes are reactive. No acute vascular findings. Reproductive: Prostate is  unremarkable. Other: Small amount of free fluid tracks into the pelvis. No free air or intra-abdominal abscess. Tiny fat containing umbilical hernia. Musculoskeletal: There are no acute or suspicious osseous abnormalities. IMPRESSION: Uncomplicated acute appendicitis. Electronically Signed   By: Narda Rutherford M.D.   On: 03/01/2018 05:57    Procedures Dr. Magnus Ivan (03/01/18) -  Laparoscopic Appendectomy  Hospital Course:  Pt is a healthy 22 yo male who presented to [facility] with abdominal pain.  Workup showed appendicitis.  Patient was admitted and underwent procedure listed above.  Tolerated procedure well and was transferred to the floor.  Diet was advanced as tolerated.  On POD#1, the patient was voiding well, tolerating diet, ambulating well, pain well controlled, vital signs stable, incisions c/d/i and felt stable for discharge home.  Patient will follow up as outlined below and knows to call with questions or concerns.     Patient was discharged in good condition.  The [place] Substance controlled database was reviewed prior to prescribing narcotic pain medication to this patient.  Physical Exam: General:  Alert, NAD, pleasant, cooperative Cardio: RRR, S1 & S2 normal, no murmur, rubs, gallops Resp: Effort normal, lungs CTA bilaterally, no wheezes, rales, rhonchi Abd:  Soft, ND, normal bowel sounds, mild tenderness around incisions, incisions with glue intact are well appearing. No guarding Skin: warm and dry, no rashes noted  Allergies as of 03/02/2018      Reactions   Amoxicillin Hives      Medication List    TAKE these medications   fexofenadine 180 MG tablet Commonly known as:  ALLEGRA Take 180 mg by mouth daily as needed for allergies or rhinitis.   oxyCODONE 5 MG immediate release tablet Commonly known as:  Oxy IR/ROXICODONE Take 1 tablet (5 mg total) by mouth every 6 (six) hours as needed for moderate pain.        Follow-up Information    [facility]  Surgery, [place]. Call.   Specialty:  General Surgery Why:  we are working on a follow up appoitnment for you. Please call to see when your appointment is.  Contact information: [address] [place] [address] [phone]          Signed: Joyce Copa [facility] 03/02/2018, 9:01 AM Pager: [phone] Consults: [phone] Mon-Fri 7:00 am-4:30 pm Sat-Sun 7:00 am-11:30 am

## 2018-03-03 LAB — NASOPHARYNGEAL CULTURE: CULTURE: NORMAL

## 2018-06-03 ENCOUNTER — Encounter (HOSPITAL_COMMUNITY): Payer: Self-pay | Admitting: Emergency Medicine

## 2018-06-03 ENCOUNTER — Ambulatory Visit (HOSPITAL_COMMUNITY)
Admission: EM | Admit: 2018-06-03 | Discharge: 2018-06-03 | Disposition: A | Discharge status: Home / Self Care | Payer: BLUE CROSS/BLUE SHIELD | Attending: Family Medicine | Admitting: Family Medicine

## 2018-06-03 ENCOUNTER — Other Ambulatory Visit: Payer: Self-pay

## 2018-06-03 DIAGNOSIS — R1084 Generalized abdominal pain: Secondary | ICD-10-CM | POA: Diagnosis not present

## 2018-06-03 DIAGNOSIS — K529 Noninfective gastroenteritis and colitis, unspecified: Secondary | ICD-10-CM | POA: Diagnosis not present

## 2018-06-03 MED ORDER — DICYCLOMINE HCL 20 MG PO TABS: 20.0000 mg | ORAL_TABLET | Freq: Two times a day (BID) | ORAL | 0 refills | Status: DC

## 2018-06-03 NOTE — ED Triage Notes (Signed)
Pt reports abdominal pain since last Friday with associated numerous BM's a day, but not diarrhea.

## 2018-06-03 NOTE — Discharge Instructions (Signed)
No alarming signs on exam. Start bentyl as directed. Keep hydrated, urine should be clear to pale yellow in color. Bland diet, advance as tolerated. Follow up with PCP for further evaluation if symptoms continues. If experiencing worsening symptoms, worsening abdominal pain, nausea/vomiting, blood in stool/vomit, unwilling to jump up and down due to pain, go to the emergency department for further evaluation needed.

## 2018-06-03 NOTE — ED Provider Notes (Signed)
[facility]    CSN: [id number] Arrival date & time: 06/03/18  1115     History   Chief Complaint Chief Complaint  Patient presents with  . Abdominal Pain    HPI Chad Willis. is a 23 y.o. male.   23 year old male comes in for few day history of frequent stools and abdominal pain. Patient states normally he has 2-3 stools a day, which can be dependant on what food he eats. For the past few days, he has had 4-5 loose stools a day. He has generalized abdominal pain that can be worse during BM, and worse in the lower quadrants. He denies melena, hematochezia.  Denies nausea, vomiting.  He is still tolerating oral intake.  Denies fever, chills, night sweats.  Denies urinary symptoms such as frequency, dysuria, hematuria.  Denies URI symptoms such as cough, congestion, sore throat.  Has not taken anything for the symptoms.     History reviewed. No pertinent past medical history.  Patient Active Problem List   Diagnosis Date Noted  . Acute appendicitis 03/01/2018    Past Surgical History:  Procedure Laterality Date  . KNEE ARTHROSCOPY WITH ANTERIOR CRUCIATE LIGAMENT (ACL) REPAIR Left   . LAPAROSCOPIC APPENDECTOMY N/A 03/01/2018   Procedure: APPENDECTOMY LAPAROSCOPIC;  Surgeon: Abigail Miyamoto, MD;  Location: [facility] OR;  Service: General;  Laterality: N/A;       Home Medications    Prior to Admission medications   Medication Sig Start Date End Date Taking? Authorizing Provider  dicyclomine (BENTYL) 20 MG tablet Take 1 tablet (20 mg total) by mouth 2 (two) times daily. 06/03/18   Cathie Hoops, Christino Mcglinchey V, PA-C  fexofenadine (ALLEGRA) 180 MG tablet Take 180 mg by mouth daily as needed for allergies or rhinitis.    [provider]  oxyCODONE (OXY IR/ROXICODONE) 5 MG immediate release tablet Take 1 tablet (5 mg total) by mouth every 6 (six) hours as needed for moderate pain. 03/02/18   Jerre Simon, PA    Family History History reviewed. No pertinent family  history.  Social History Social History   Tobacco Use  . Smoking status: Never Smoker  Substance Use Topics  . Alcohol use: Yes    Comment: occasional  . Drug use: Never     Allergies   Amoxicillin   Review of Systems Review of Systems  Reason unable to perform ROS: See HPI as above.     Physical Exam Triage Vital Signs ED Triage Vitals [06/03/18 1144]  Enc Vitals Group     BP 121/72     Pulse Rate 62     Resp      Temp 98.5 F (36.9 C)     Temp Source Temporal     SpO2 100 %     Weight      Height      Head Circumference      Peak Flow      Pain Score 2     Pain Loc      Pain Edu?      Excl. in GC?    No data found.  Updated Vital Signs BP 121/72 (BP Location: Right Arm)   Pulse 62   Temp 98.5 F (36.9 C) (Temporal)   SpO2 100%   Physical Exam Constitutional:      General: He is not in acute distress.    Appearance: He is well-developed. He is not ill-appearing, toxic-appearing or diaphoretic.  HENT:     Head: Normocephalic  and atraumatic.  Cardiovascular:     Rate and Rhythm: Normal rate and regular rhythm.     Heart sounds: Normal heart sounds. No murmur. No friction rub. No gallop.   Pulmonary:     Effort: Pulmonary effort is normal.     Breath sounds: Normal breath sounds. No wheezing or rales.  Abdominal:     General: Bowel sounds are normal.     Palpations: Abdomen is soft.     Tenderness: There is no right CVA tenderness, left CVA tenderness, guarding or rebound.  Skin:    General: Skin is warm and dry.  Neurological:     Mental Status: He is alert and oriented to person, place, and time.  Psychiatric:        Behavior: Behavior normal.        Judgment: Judgment normal.      UC Treatments / Results  Labs (all labs ordered are listed, but only abnormal results are displayed) Labs Reviewed - No data to display  EKG None  Radiology No results found.  Procedures Procedures (including critical care time)  Medications  Ordered in UC Medications - No data to display  Initial Impression / Assessment and Plan / UC Course  I have reviewed the triage vital signs and the nursing notes.  Pertinent labs & imaging results that were available during my care of the patient were reviewed by me and considered in my medical decision making (see chart for details).    ?IBS given frequent stool at baseline with changes dependant on food intake. Also discussed possible viral illness causing symptoms. No alarming signs on exam. Will provide bentyl as directed. Push fluids. Return precautions given. Patient expresses understanding and agrees to plan.  Final Clinical Impressions(s) / UC Diagnoses   Final diagnoses:  Generalized abdominal pain  Frequent stools    ED Prescriptions    Medication Sig Dispense Auth. Provider   dicyclomine (BENTYL) 20 MG tablet Take 1 tablet (20 mg total) by mouth 2 (two) times daily. 20 tablet Threasa Alpha, [place] 06/03/18 1255

## 2018-07-28 ENCOUNTER — Emergency Department (HOSPITAL_COMMUNITY)
Admission: EM | Admit: 2018-07-28 | Discharge: 2018-07-28 | Disposition: A | Discharge status: Home / Self Care | Payer: BLUE CROSS/BLUE SHIELD | Attending: Emergency Medicine | Admitting: Emergency Medicine

## 2018-07-28 ENCOUNTER — Other Ambulatory Visit: Payer: Self-pay

## 2018-07-28 ENCOUNTER — Encounter (HOSPITAL_COMMUNITY): Payer: Self-pay

## 2018-07-28 ENCOUNTER — Emergency Department (HOSPITAL_COMMUNITY): Payer: BLUE CROSS/BLUE SHIELD

## 2018-07-28 DIAGNOSIS — Z79899 Other long term (current) drug therapy: Secondary | ICD-10-CM | POA: Insufficient documentation

## 2018-07-28 DIAGNOSIS — R0789 Other chest pain: Secondary | ICD-10-CM | POA: Diagnosis present

## 2018-07-28 LAB — CBC
HCT: 46.3 % (ref 39.0–52.0)
Hemoglobin: 14.6 g/dL (ref 13.0–17.0)
MCH: 28 pg (ref 26.0–34.0)
MCHC: 31.5 g/dL (ref 30.0–36.0)
MCV: 88.9 fL (ref 80.0–100.0)
Platelets: 328 10*3/uL (ref 150–400)
RBC: 5.21 MIL/uL (ref 4.22–5.81)
RDW: 13.2 % (ref 11.5–15.5)
WBC: 6.3 10*3/uL (ref 4.0–10.5)
nRBC: 0 % (ref 0.0–0.2)

## 2018-07-28 LAB — BASIC METABOLIC PANEL
Anion gap: 11 (ref 5–15)
BUN: 8 mg/dL (ref 6–20)
CALCIUM: 9.4 mg/dL (ref 8.9–10.3)
CO2: 23 mmol/L (ref 22–32)
Chloride: 107 mmol/L (ref 98–111)
Creatinine, Ser: 1.19 mg/dL (ref 0.61–1.24)
GFR calc Af Amer: >60 mL/min (ref >60)
GFR calc non Af Amer: >60 mL/min (ref >60)
Glucose, Bld: 102 mg/dL — ABNORMAL HIGH (ref 70–99)
Potassium: 4.3 mmol/L (ref 3.5–5.1)
Sodium: 141 mmol/L (ref 135–145)

## 2018-07-28 LAB — I-STAT TROPONIN, ED
TROPONIN I, POC: 0.01 ng/mL (ref 0.00–0.08)
Troponin i, poc: 0 ng/mL (ref 0.00–0.08)

## 2018-07-28 MED ORDER — SODIUM CHLORIDE 0.9% FLUSH: 3.0000 mL | Freq: Once | INTRAVENOUS | Status: DC

## 2018-07-28 MED ORDER — ALUM & MAG HYDROXIDE-SIMETH 200-200-20 MG/5ML PO SUSP
30.0000 mL | Freq: Once | ORAL | Status: AC
  Administered 2018-07-28: 30 mL via ORAL
  Filled 2018-07-28: qty 30

## 2018-07-28 MED ORDER — LIDOCAINE VISCOUS HCL 2 % MT SOLN
15.0000 mL | Freq: Once | OROMUCOSAL | Status: AC
  Administered 2018-07-28: 15 mL via ORAL
  Filled 2018-07-28: qty 15

## 2018-07-28 MED ORDER — FAMOTIDINE 20 MG PO TABS: 20.0000 mg | ORAL_TABLET | Freq: Every day | ORAL | 0 refills | Status: DC

## 2018-07-28 NOTE — Discharge Instructions (Signed)
Take Pepcid daily. If you are having pain, use Tylenol.  Avoid anti-inflammatories such as ibuprofen. Call the [phone]  number in the paperwork to help set up primary care. Return to the emergency room with any new, worsening, concerning symptoms.

## 2018-07-28 NOTE — ED Triage Notes (Signed)
Pt took a nap yesterday and woke up with chest pain.  Pain comes and goes.  A&Ox4.

## 2018-07-28 NOTE — ED Provider Notes (Signed)
[facility] EMERGENCY DEPARTMENT Provider Note   CSN: [id number] Arrival date & time: 07/28/18  0036    History   Chief Complaint Chief Complaint  Patient presents with  . Chest Pain    HPI Chad Willis. is a 23 y.o. male presenting for evaluation of chest pain.  Patient states after a nap yesterday he woke up and had chest discomfort.  Since then, pain has been intermittent, worse when he is laying flat.  Patient reports sharp pain last for several seconds before becoming an ache.  He has not taken anything for his pain.  Pain is not worse with exertion.  He denies associated shortness of breath, nausea, vomiting.  He denies recent fevers, chills, sore throat, cough, abdominal pain, urinary symptoms, normal bowel movements.  Patient states he has no medical problems, takes no medications daily.  Patient states every once in a while he has symptoms that he thinks are consistent with reflux.  This is been happening more often than normal.     HPI  History reviewed. No pertinent past medical history.  Patient Active Problem List   Diagnosis Date Noted  . Acute appendicitis 03/01/2018    Past Surgical History:  Procedure Laterality Date  . KNEE ARTHROSCOPY WITH ANTERIOR CRUCIATE LIGAMENT (ACL) REPAIR Left   . LAPAROSCOPIC APPENDECTOMY N/A 03/01/2018   Procedure: APPENDECTOMY LAPAROSCOPIC;  Surgeon: Abigail Miyamoto, MD;  Location: [facility] OR;  Service: General;  Laterality: N/A;        Home Medications    Prior to Admission medications   Medication Sig Start Date End Date Taking? Authorizing Provider  dicyclomine (BENTYL) 20 MG tablet Take 1 tablet (20 mg total) by mouth 2 (two) times daily. 06/03/18   Cathie Hoops, Amy V, PA-C  famotidine (PEPCID) 20 MG tablet Take 1 tablet (20 mg total) by mouth daily for 30 days. 07/28/18 08/27/18  Alec Jaros, PA-C  fexofenadine (ALLEGRA) 180 MG tablet Take 180 mg by mouth daily as needed for allergies or rhinitis.     [provider]  oxyCODONE (OXY IR/ROXICODONE) 5 MG immediate release tablet Take 1 tablet (5 mg total) by mouth every 6 (six) hours as needed for moderate pain. 03/02/18   Jerre Simon, PA    Family History History reviewed. No pertinent family history.  Social History Social History   Tobacco Use  . Smoking status: Never Smoker  . Smokeless tobacco: Never Used  Substance Use Topics  . Alcohol use: Yes    Comment: occasional  . Drug use: Never     Allergies   Amoxicillin   Review of Systems Review of Systems  Cardiovascular: Positive for chest pain.  All other systems reviewed and are negative.    Physical Exam Updated Vital Signs BP 119/61 (BP Location: Right Arm)   Pulse 75   Temp 98.2 F (36.8 C) (Oral)   Resp 14   SpO2 100%   Physical Exam Vitals signs and nursing note reviewed.  Constitutional:      General: He is not in acute distress.    Appearance: He is well-developed.     Comments: Laying comfortably in the bed in NAD  HENT:     Head: Normocephalic and atraumatic.     Mouth/Throat:     Mouth: Mucous membranes are moist.  Eyes:     Conjunctiva/sclera: Conjunctivae normal.     Pupils: Pupils are equal, round, and reactive to light.  Neck:     Musculoskeletal: Normal range of  motion and neck supple.  Cardiovascular:     Rate and Rhythm: Normal rate and regular rhythm.     Pulses: Normal pulses.  Pulmonary:     Effort: Pulmonary effort is normal. No respiratory distress.     Breath sounds: Normal breath sounds. No wheezing.     Comments: TTP of L lower chest wall. Speaking in full sentences. Clear lung sounds in all fields.  Chest:     Chest wall: Tenderness present.  Abdominal:     General: There is no distension.     Palpations: Abdomen is soft. There is no mass.     Tenderness: There is no abdominal tenderness. There is no guarding or rebound.  Musculoskeletal: Normal range of motion.  Skin:    General: Skin is warm and dry.       Capillary Refill: Capillary refill takes less than 2 seconds.  Neurological:     Mental Status: He is alert and oriented to person, place, and time.      ED Treatments / Results  Labs (all labs ordered are listed, but only abnormal results are displayed) Labs Reviewed  BASIC METABOLIC PANEL - Abnormal; Notable for the following components:      Result Value   Glucose, Bld 102 (*)    All other components within normal limits  CBC  I-STAT TROPONIN, ED  I-STAT TROPONIN, ED    EKG EKG Interpretation  Date/Time:  Tuesday July 28 2018 00:48:37 EST Ventricular Rate:  79 PR Interval:  150 QRS Duration: 84 QT Interval:  346 QTC Calculation: 396 R Axis:   -7 Text Interpretation:  Normal sinus rhythm with sinus arrhythmia RSR' or QR pattern in V1 suggests right ventricular conduction delay Confirmed by Nicanor Alcon, April ([id number]) on 07/28/2018 6:15:40 AM   Radiology Dg Chest 2 View  Result Date: 07/28/2018 CLINICAL DATA:  Initial evaluation for acute chest pain. EXAM: CHEST - 2 VIEW COMPARISON:  None. FINDINGS: The cardiac and mediastinal silhouettes are within normal limits. The lungs are normally inflated. No airspace consolidation, pleural effusion, or pulmonary edema is identified. There is no pneumothorax. No acute osseous abnormality identified. IMPRESSION: No active cardiopulmonary disease. Electronically Signed   By: Rise Mu M.D.   On: 07/28/2018 01:20    Procedures Procedures (including critical care time)  Medications Ordered in ED Medications  sodium chloride flush (NS) 0.9 % injection 3 mL (has no administration in time range)  alum & mag hydroxide-simeth (MAALOX/MYLANTA) 200-200-20 MG/5ML suspension 30 mL (30 mLs Oral Given 07/28/18 0550)    And  lidocaine (XYLOCAINE) 2 % viscous mouth solution 15 mL (15 mLs Oral Given 07/28/18 0550)     Initial Impression / Assessment and Plan / ED Course  I have reviewed the triage vital signs and the nursing  notes.  Pertinent labs & imaging results that were available during my care of the patient were reviewed by me and considered in my medical decision making (see chart for details).       Pt presenting for evaluation of chest pain. Physical exam reassuring, appears nontoxic. Pain began when laying flat, and is more often associated with laying down. As such, consider GERD. Labs reassuring, trop negative. No leukocytosis, electrolytes stable. cxr viewed and interpreted by me, no PNA, PNX, effusion, or cardiomegaly. EKG without stemi. Low suspicion for ACS, considering pt's age, intermittent pain, no risk factors, and no associated sxs. Will given GI cocktail, delta trop and reassess.   Delta trop negative. Pain improved with GI  cocktail, consider Gi cause. Will tiral pepcid for sx control. Pt encouraged to f/u with pcp. Discussed with pt. discussed f/u with pcp. At this time, pt appears safe for d/c. Return precautions given. Pt states he understands and agrees to plan.   Final Clinical Impressions(s) / ED Diagnoses   Final diagnoses:  Atypical chest pain    ED Discharge Orders         Ordered    famotidine (PEPCID) 20 MG tablet  Daily     07/28/18 0706           Alveria Apley, PA-C 07/28/18 5498    Palumbo, April, MD 08/03/18 2355

## 2019-02-08 IMAGING — CT CT ABD-PELV W/ CM
2 of 4 series · 16 of 46 positions shown, 18 images · IV contrast (Omni 300)
Comparison: None.

CLINICAL DATA: Abdominal distention and right lower quadrant pain.

EXAM:
CT ABDOMEN AND PELVIS WITH CONTRAST
TECHNIQUE: Multidetector CT imaging of the abdomen and pelvis was performed
using the standard protocol following bolus administration of
intravenous contrast.
CONTRAST:  100mL OMNIPAQUE IOHEXOL 300 MG/ML  SOLN

[Series 3: a/p w/ 5mm · axial · 0.94mm/px · z∈[+845,+1330]mm · 13 of 107 slices shown, 15 images]
[im 5/107  soft-tissue]
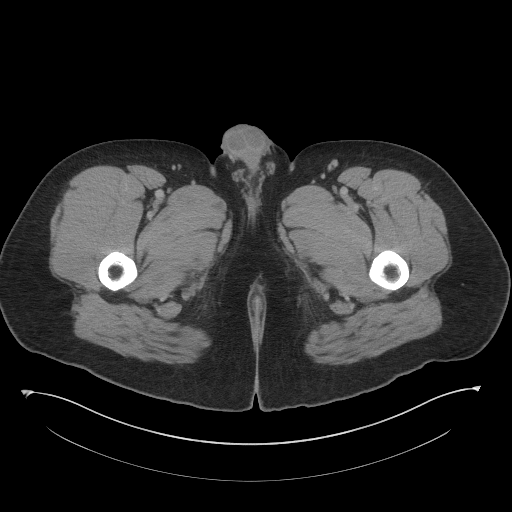
[im 5/107  bone]
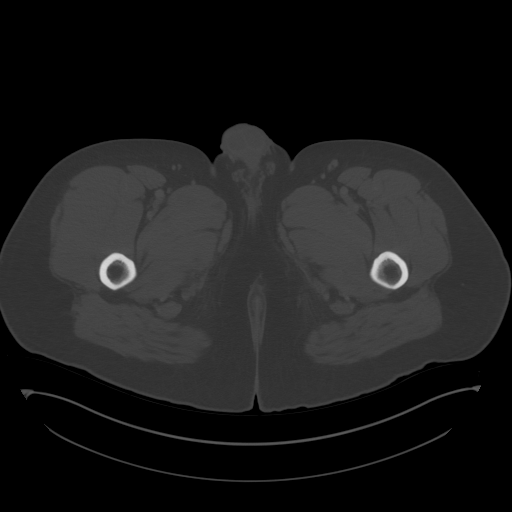
[im 13/107  soft-tissue]
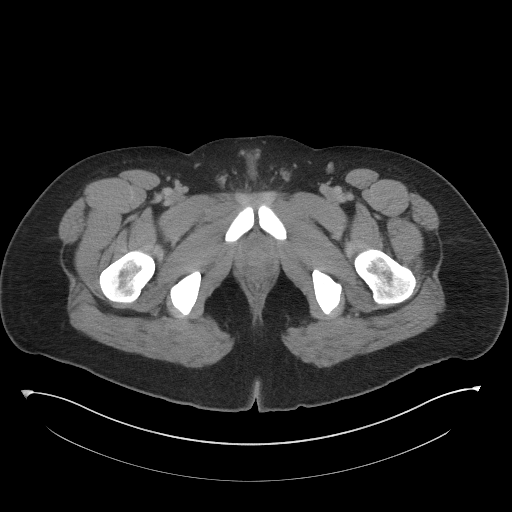
[im 22/107  soft-tissue]
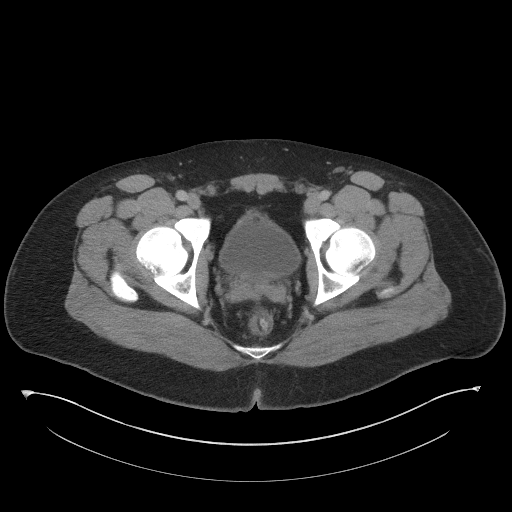
[im 30/107  soft-tissue]
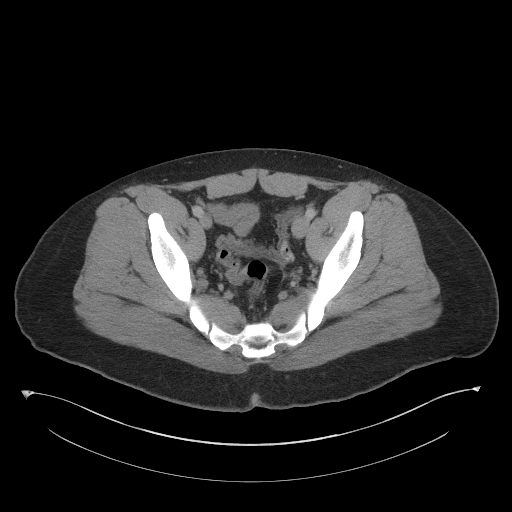
[im 39/107  soft-tissue]
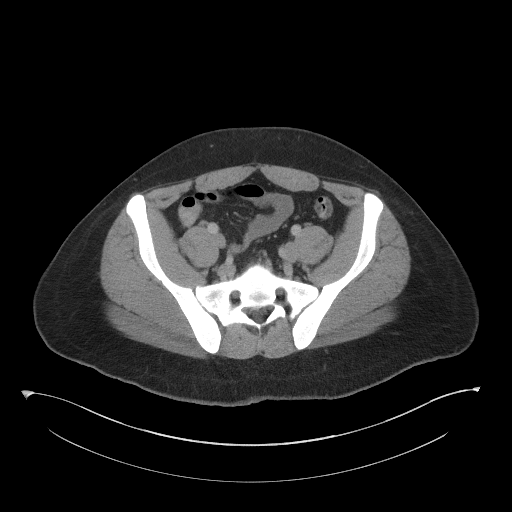
[im 47/107  soft-tissue]
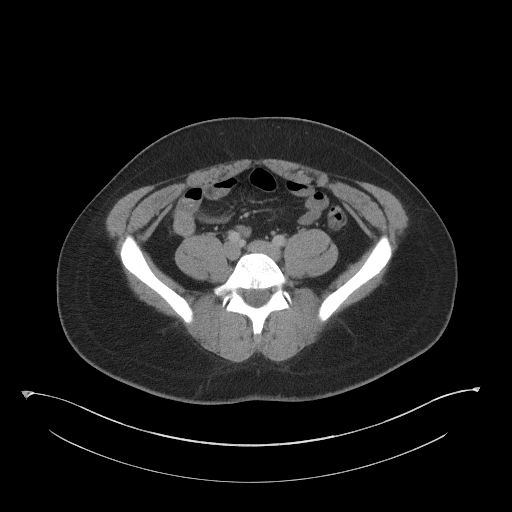
[im 56/107  soft-tissue]
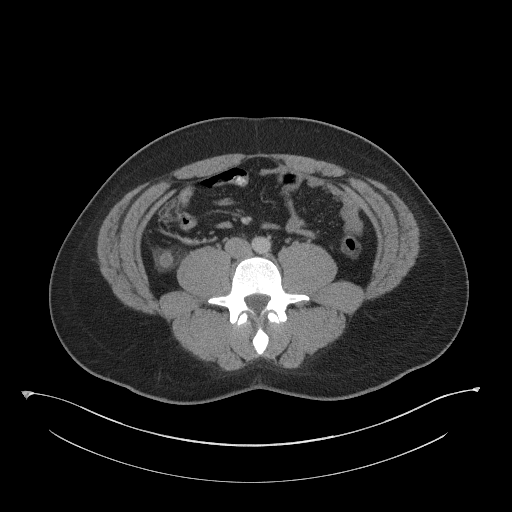
[im 60/107  soft-tissue]
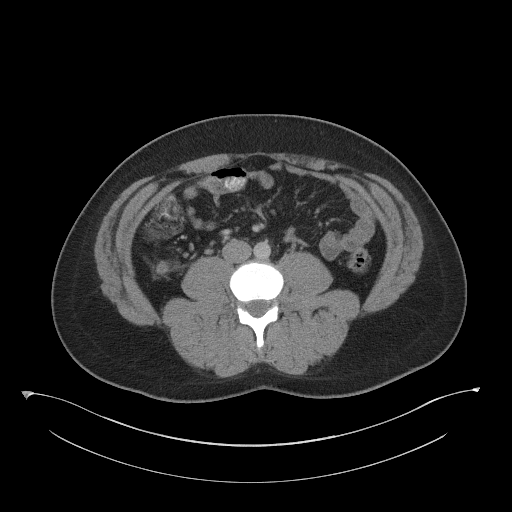
[im 68/107  soft-tissue]
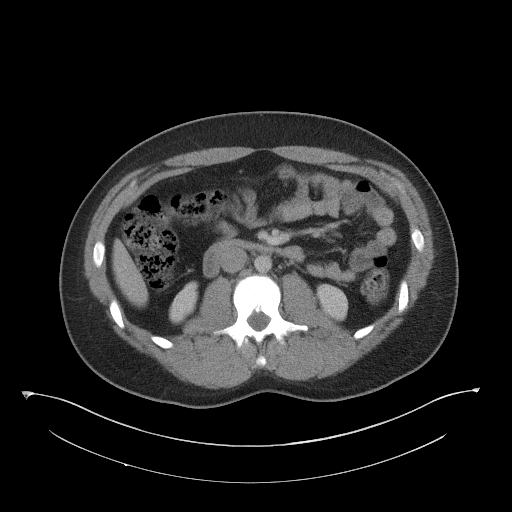
[im 68/107  bone]
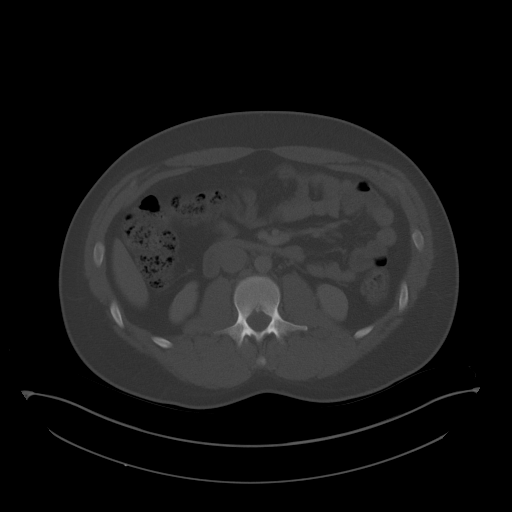
[im 77/107  soft-tissue]
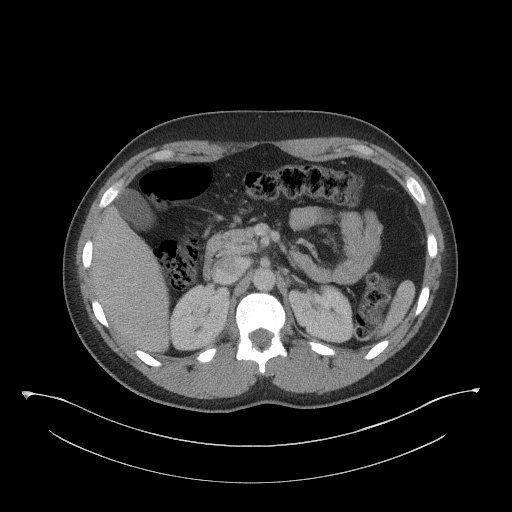
[im 85/107  soft-tissue]
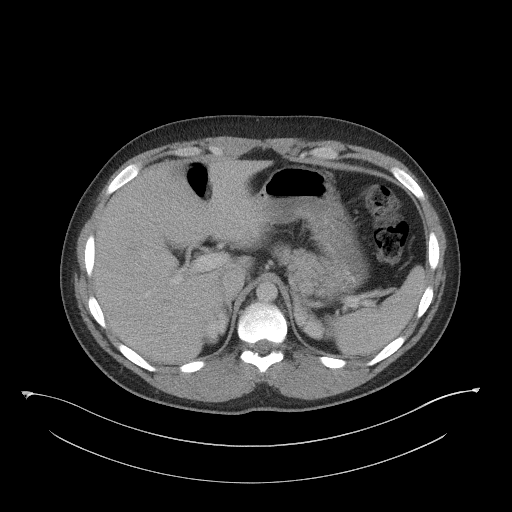
[im 94/107  soft-tissue]
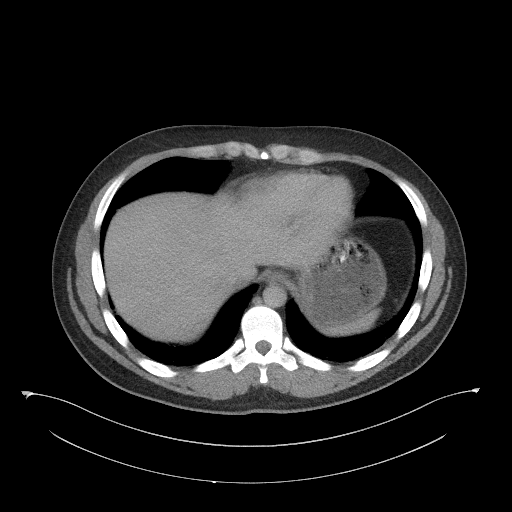
[im 102/107  soft-tissue]
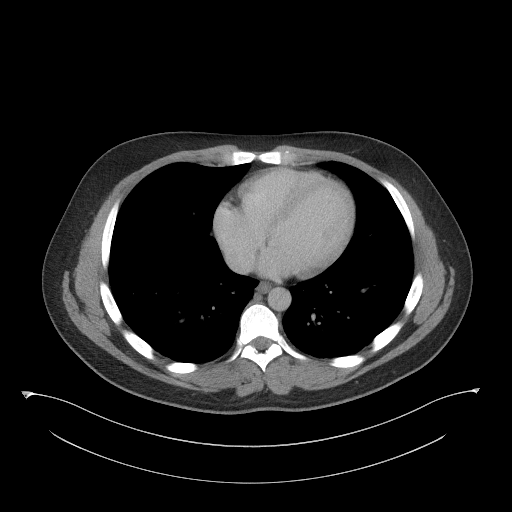

[Series 6: a/p w/ cor · coronal · 1.00mm/px · 3 of 151 slices shown]
[im 51/151  soft-tissue]
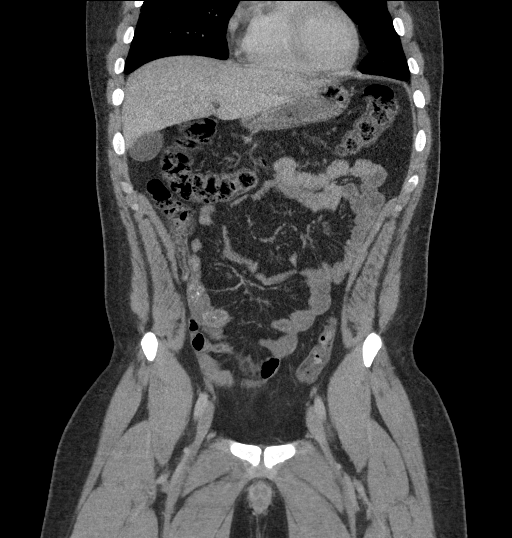
[im 67/151  soft-tissue]
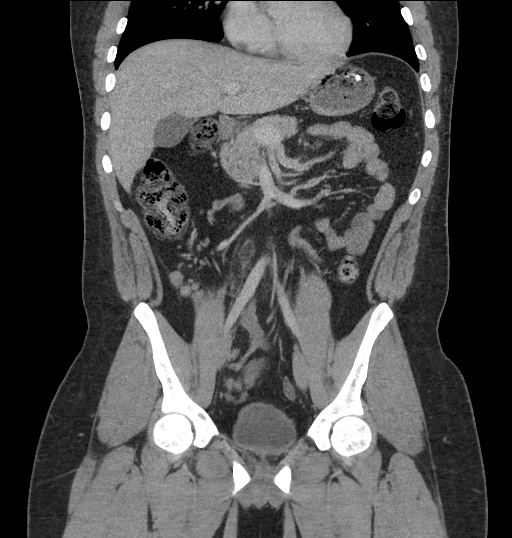
[im 84/151  soft-tissue]
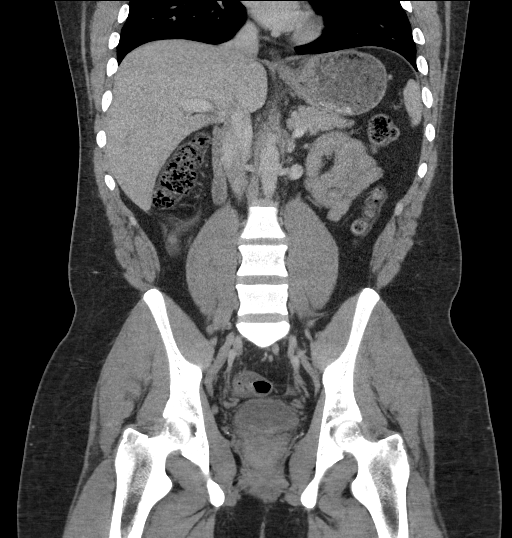

[16 of 46 positions shown; findings below may reference images not displayed]

FINDINGS: Lower chest: Minor atelectasis the lung bases.

Hepatobiliary: Focal fatty infiltration adjacent to the falciform
ligament. No suspicious hepatic lesion. Gallbladder physiologically
distended, no calcified stone. No biliary dilatation.

Pancreas: No ductal dilatation or inflammation.

Spleen: Normal in size without focal abnormality.

Adrenals/Urinary Tract: Normal adrenal glands. No hydronephrosis or
perinephric edema. Homogeneous renal enhancement. Urinary bladder is
partially distended without wall thickening.

Stomach/Bowel: Dilated fluid-filled appendix with periappendiceal
stranding as described below. Stomach distended with ingested
contents. No small bowel dilatation or inflammation. No bowel
obstruction. Moderate stool in the ascending, transverse, and
descending colon, more distal colon is decompressed.

Appendix: Location: Retrocecal

Diameter: 14 mm

Appendicolith: Yes at the base.

Mucosal hyper-enhancement: Yes

Extraluminal gas: No

Periappendiceal collection: Small amount of free fluid but no
well-defined fluid collection.

Vascular/Lymphatic: Prominent pericecal lymph nodes are reactive. No
acute vascular findings.

Reproductive: Prostate is unremarkable.

Other: Small amount of free fluid tracks into the pelvis. No free
air or intra-abdominal abscess. Tiny fat containing umbilical
hernia.

Musculoskeletal: There are no acute or suspicious osseous
abnormalities.
IMPRESSION: Uncomplicated acute appendicitis.

## 2019-08-18 ENCOUNTER — Emergency Department (HOSPITAL_COMMUNITY)
Admission: EM | Admit: 2019-08-18 | Discharge: 2019-08-18 | Disposition: A | Discharge status: Home / Self Care | Payer: 59 | Attending: Emergency Medicine | Admitting: Emergency Medicine

## 2019-08-18 ENCOUNTER — Other Ambulatory Visit: Payer: Self-pay

## 2019-08-18 ENCOUNTER — Encounter (HOSPITAL_COMMUNITY): Payer: Self-pay | Admitting: Pharmacy Technician

## 2019-08-18 DIAGNOSIS — R1032 Left lower quadrant pain: Secondary | ICD-10-CM | POA: Insufficient documentation

## 2019-08-18 DIAGNOSIS — R109 Unspecified abdominal pain: Secondary | ICD-10-CM

## 2019-08-18 LAB — COMPREHENSIVE METABOLIC PANEL
ALT: 19 U/L (ref 0–44)
AST: 25 U/L (ref 15–41)
Albumin: 3.9 g/dL (ref 3.5–5.0)
Alkaline Phosphatase: 73 U/L (ref 38–126)
Anion gap: 8 (ref 5–15)
BUN: <5 mg/dL — ABNORMAL LOW (ref 6–20)
CO2: 23 mmol/L (ref 22–32)
Calcium: 9.3 mg/dL (ref 8.9–10.3)
Chloride: 107 mmol/L (ref 98–111)
Creatinine, Ser: 1.05 mg/dL (ref 0.61–1.24)
GFR calc Af Amer: >60 mL/min (ref >60)
GFR calc non Af Amer: >60 mL/min (ref >60)
Glucose, Bld: 128 mg/dL — ABNORMAL HIGH (ref 70–99)
Potassium: 4 mmol/L (ref 3.5–5.1)
Sodium: 138 mmol/L (ref 135–145)
Total Bilirubin: 1.1 mg/dL (ref 0.3–1.2)
Total Protein: 7.3 g/dL (ref 6.5–8.1)

## 2019-08-18 LAB — LIPASE, BLOOD: Lipase: 33 U/L (ref 11–51)

## 2019-08-18 LAB — CBC
HCT: 47.5 % (ref 39.0–52.0)
Hemoglobin: 15.1 g/dL (ref 13.0–17.0)
MCH: 28.5 pg (ref 26.0–34.0)
MCHC: 31.8 g/dL (ref 30.0–36.0)
MCV: 89.6 fL (ref 80.0–100.0)
Platelets: 313 10*3/uL (ref 150–400)
RBC: 5.3 MIL/uL (ref 4.22–5.81)
RDW: 13.3 % (ref 11.5–15.5)
WBC: 5 10*3/uL (ref 4.0–10.5)
nRBC: 0 % (ref 0.0–0.2)

## 2019-08-18 LAB — URINALYSIS, ROUTINE W REFLEX MICROSCOPIC
Bilirubin Urine: NEGATIVE
Glucose, UA: NEGATIVE mg/dL
Hgb urine dipstick: NEGATIVE
Ketones, ur: NEGATIVE mg/dL
Leukocytes,Ua: NEGATIVE
Nitrite: NEGATIVE
Protein, ur: NEGATIVE mg/dL
Specific Gravity, Urine: 1.014 (ref 1.005–1.030)
pH: 5 (ref 5.0–8.0)

## 2019-08-18 MED ORDER — SODIUM CHLORIDE 0.9% FLUSH: 3.0000 mL | Freq: Once | INTRAVENOUS | Status: DC

## 2019-08-18 NOTE — Discharge Instructions (Signed)
Take Ibuprofen or Tylenol for pain Return for any worsening symptoms (severe pain, vomiting, fever)

## 2019-08-18 NOTE — ED Notes (Signed)
Pt discharged at this time. Discharge instructions reviewed, opportunity to ask questions provided, pt verbalizes understanding of discharge instructions.   

## 2019-08-18 NOTE — ED Provider Notes (Signed)
[facility] EMERGENCY DEPARTMENT Provider Note   CSN: [id number] Arrival date & time: 08/18/19  1614     History Chief Complaint  Patient presents with  . Abdominal Pain    Chad Willis. is a 24 y.o. male who presents with abdominal pain.  Patient states that yesterday he started to notice some left-sided abdominal pain.  He describes it as a soreness.  He will also get pain that sometimes radiates on the right side as well but today it is only been on the left side and was a little bit worse.  He states that he has never had this before.  He denies any associated symptoms.  No fever, chills, nausea, vomiting, constipation, diarrhea.  He had a little bit of pressure when he urinated earlier but this has not been consistent.  Prior surgical history significant for appendicitis.  He was not sure what was causing the pain therefore he decided to come to the ED to get checked out.  HPI     History reviewed. No pertinent past medical history.  Patient Active Problem List   Diagnosis Date Noted  . Acute appendicitis 03/01/2018    Past Surgical History:  Procedure Laterality Date  . KNEE ARTHROSCOPY WITH ANTERIOR CRUCIATE LIGAMENT (ACL) REPAIR Left   . LAPAROSCOPIC APPENDECTOMY N/A 03/01/2018   Procedure: APPENDECTOMY LAPAROSCOPIC;  Surgeon: Abigail Miyamoto, MD;  Location: [facility] OR;  Service: General;  Laterality: N/A;       No family history on file.  Social History   Tobacco Use  . Smoking status: Never Smoker  . Smokeless tobacco: Never Used  Substance Use Topics  . Alcohol use: Yes    Comment: occasional  . Drug use: Never    Home Medications Prior to Admission medications   Medication Sig Start Date End Date Taking? Authorizing Provider  dicyclomine (BENTYL) 20 MG tablet Take 1 tablet (20 mg total) by mouth 2 (two) times daily. 06/03/18   Cathie Hoops, Amy V, PA-C  famotidine (PEPCID) 20 MG tablet Take 1 tablet (20 mg total) by mouth daily for 30  days. 07/28/18 08/27/18  Caccavale, Sophia, PA-C  fexofenadine (ALLEGRA) 180 MG tablet Take 180 mg by mouth daily as needed for allergies or rhinitis.    [provider]  oxyCODONE (OXY IR/ROXICODONE) 5 MG immediate release tablet Take 1 tablet (5 mg total) by mouth every 6 (six) hours as needed for moderate pain. 03/02/18   Focht, Joyce Copa, PA    Allergies    Amoxicillin  Review of Systems   Review of Systems  Constitutional: Negative for chills and fever.  Gastrointestinal: Positive for abdominal pain. Negative for constipation, diarrhea, nausea and vomiting.  Genitourinary: Positive for flank pain.  All other systems reviewed and are negative.   Physical Exam Updated Vital Signs BP (!) 141/77   Pulse 91   Temp 97.8 F (36.6 C) (Oral)   Resp 15   Ht 6' (1.829 m)   Wt 117 kg   SpO2 99%   BMI 34.99 kg/m   Physical Exam Vitals and nursing note reviewed.  Constitutional:      General: He is not in acute distress.    Appearance: He is well-developed. He is not ill-appearing.  HENT:     Head: Normocephalic and atraumatic.  Eyes:     General: No scleral icterus.       Right eye: No discharge.        Left eye: No discharge.  Conjunctiva/sclera: Conjunctivae normal.     Pupils: Pupils are equal, round, and reactive to light.  Cardiovascular:     Rate and Rhythm: Normal rate.  Pulmonary:     Effort: Pulmonary effort is normal. No respiratory distress.  Abdominal:     General: Abdomen is protuberant. Bowel sounds are normal. There is no distension.     Palpations: Abdomen is soft.     Tenderness: There is no abdominal tenderness. There is no right CVA tenderness or left CVA tenderness.  Musculoskeletal:     Cervical back: Normal range of motion.  Skin:    General: Skin is warm and dry.  Neurological:     Mental Status: He is alert and oriented to person, place, and time.  Psychiatric:        Behavior: Behavior normal.     ED Results / Procedures /  Treatments   Labs (all labs ordered are listed, but only abnormal results are displayed) Labs Reviewed  COMPREHENSIVE METABOLIC PANEL - Abnormal; Notable for the following components:      Result Value   Glucose, Bld 128 (*)    BUN <5 (*)    All other components within normal limits  LIPASE, BLOOD  CBC  URINALYSIS, ROUTINE W REFLEX MICROSCOPIC    EKG None  Radiology No results found.  Procedures Procedures (including critical care time)  Medications Ordered in ED Medications  sodium chloride flush (NS) 0.9 % injection 3 mL (has no administration in time range)    ED Course  I have reviewed the triage vital signs and the nursing notes.  Pertinent labs & imaging results that were available during my care of the patient were reviewed by me and considered in my medical decision making (see chart for details).  24 year old male with abdominal pain is primarily left-sided and in the left flank since yesterday.  The pain comes and goes.  It is vague in nature and mild to moderate.  Seems to be worse with movement and lying on his side.  His vital signs are normal.  His blood work is reassuring.  His urine sample is normal.  We discussed possible imaging however I advised with overall reassuring appearance, vitals, labs that this was not required.  Shared decision was made made to hold off on further imaging at this time and to treat with over-the-counter medicine for pain which he has not tried yet.  Pain could very well be musculoskeletal in nature.  Pain could be possibly kidney stone since it is intermittent however it is so mild that I doubt this.  He was given strict return precautions and is comfortable with this plan.  MDM Rules/Calculators/A&P                       Final Clinical Impression(s) / ED Diagnoses Final diagnoses:  Left flank pain    Rx / DC Orders ED Discharge Orders    None       Recardo Evangelist, PA-C 08/18/19 2028    Malvin Johns, MD 08/18/19  2242

## 2019-08-18 NOTE — ED Triage Notes (Signed)
Pt from home with central abd pain onset yesterday. States started out yesterday as L sided abd pain. Denies NVD.

## 2019-08-29 ENCOUNTER — Encounter (HOSPITAL_COMMUNITY): Payer: Self-pay | Admitting: *Deleted

## 2019-08-29 ENCOUNTER — Emergency Department (HOSPITAL_COMMUNITY)
Admission: EM | Admit: 2019-08-29 | Discharge: 2019-08-30 | Disposition: A | Discharge status: Home / Self Care | Payer: 59 | Attending: Emergency Medicine | Admitting: Emergency Medicine

## 2019-08-29 ENCOUNTER — Other Ambulatory Visit: Payer: Self-pay

## 2019-08-29 DIAGNOSIS — Z5321 Procedure and treatment not carried out due to patient leaving prior to being seen by health care provider: Secondary | ICD-10-CM | POA: Diagnosis not present

## 2019-08-29 DIAGNOSIS — R109 Unspecified abdominal pain: Secondary | ICD-10-CM | POA: Diagnosis present

## 2019-08-29 LAB — CBC
HCT: 46.4 % (ref 39.0–52.0)
Hemoglobin: 14.4 g/dL (ref 13.0–17.0)
MCH: 28.3 pg (ref 26.0–34.0)
MCHC: 31 g/dL (ref 30.0–36.0)
MCV: 91.3 fL (ref 80.0–100.0)
Platelets: 325 10*3/uL (ref 150–400)
RBC: 5.08 MIL/uL (ref 4.22–5.81)
RDW: 13.2 % (ref 11.5–15.5)
WBC: 4.9 10*3/uL (ref 4.0–10.5)
nRBC: 0 % (ref 0.0–0.2)

## 2019-08-29 LAB — URINALYSIS, ROUTINE W REFLEX MICROSCOPIC
Bilirubin Urine: NEGATIVE
Glucose, UA: NEGATIVE mg/dL
Hgb urine dipstick: NEGATIVE
Ketones, ur: NEGATIVE mg/dL
Leukocytes,Ua: NEGATIVE
Nitrite: NEGATIVE
Protein, ur: NEGATIVE mg/dL
Specific Gravity, Urine: 1.013 (ref 1.005–1.030)
pH: 5 (ref 5.0–8.0)

## 2019-08-29 MED ORDER — SODIUM CHLORIDE 0.9% FLUSH: 3.0000 mL | Freq: Once | INTRAVENOUS | Status: DC

## 2019-08-29 NOTE — ED Triage Notes (Signed)
Lt upper abd pain

## 2019-08-29 NOTE — ED Triage Notes (Signed)
The pt is c/o abd pain for one week  He was seen here for the same then  Not constipated

## 2019-08-30 LAB — COMPREHENSIVE METABOLIC PANEL
ALT: 21 U/L (ref 0–44)
AST: 20 U/L (ref 15–41)
Albumin: 4 g/dL (ref 3.5–5.0)
Alkaline Phosphatase: 67 U/L (ref 38–126)
Anion gap: 10 (ref 5–15)
BUN: 7 mg/dL (ref 6–20)
CO2: 24 mmol/L (ref 22–32)
Calcium: 9.3 mg/dL (ref 8.9–10.3)
Chloride: 105 mmol/L (ref 98–111)
Creatinine, Ser: 1.18 mg/dL (ref 0.61–1.24)
GFR calc Af Amer: >60 mL/min (ref >60)
GFR calc non Af Amer: >60 mL/min (ref >60)
Glucose, Bld: 108 mg/dL — ABNORMAL HIGH (ref 70–99)
Potassium: 3.6 mmol/L (ref 3.5–5.1)
Sodium: 139 mmol/L (ref 135–145)
Total Bilirubin: 1.2 mg/dL (ref 0.3–1.2)
Total Protein: 7.1 g/dL (ref 6.5–8.1)

## 2019-08-30 LAB — LIPASE, BLOOD: Lipase: 29 U/L (ref 11–51)

## 2020-05-24 DIAGNOSIS — Z20822 Contact with and (suspected) exposure to covid-19: Secondary | ICD-10-CM | POA: Diagnosis not present

## 2020-12-13 DIAGNOSIS — Z20822 Contact with and (suspected) exposure to covid-19: Secondary | ICD-10-CM | POA: Diagnosis not present

## 2021-01-17 DIAGNOSIS — Z20822 Contact with and (suspected) exposure to covid-19: Secondary | ICD-10-CM | POA: Diagnosis not present

## 2021-04-13 ENCOUNTER — Other Ambulatory Visit: Payer: Self-pay

## 2021-04-13 ENCOUNTER — Ambulatory Visit (HOSPITAL_COMMUNITY)
Admission: EM | Admit: 2021-04-13 | Discharge: 2021-04-13 | Disposition: A | Discharge status: Home / Self Care | Payer: BC Managed Care – PPO | Attending: Physician Assistant | Admitting: Physician Assistant

## 2021-04-13 ENCOUNTER — Emergency Department (HOSPITAL_COMMUNITY)
Admission: EM | Admit: 2021-04-13 | Discharge: 2021-04-13 | Disposition: A | Discharge status: Home / Self Care | Payer: BC Managed Care – PPO | Attending: Emergency Medicine | Admitting: Emergency Medicine

## 2021-04-13 ENCOUNTER — Encounter (HOSPITAL_COMMUNITY): Payer: Self-pay

## 2021-04-13 DIAGNOSIS — Z5321 Procedure and treatment not carried out due to patient leaving prior to being seen by health care provider: Secondary | ICD-10-CM | POA: Diagnosis not present

## 2021-04-13 DIAGNOSIS — K0889 Other specified disorders of teeth and supporting structures: Secondary | ICD-10-CM

## 2021-04-13 DIAGNOSIS — K047 Periapical abscess without sinus: Secondary | ICD-10-CM | POA: Diagnosis not present

## 2021-04-13 MED ORDER — CLINDAMYCIN HCL 300 MG PO CAPS: 300.0000 mg | ORAL_CAPSULE | Freq: Three times a day (TID) | ORAL | 0 refills | Status: DC

## 2021-04-13 MED ORDER — KETOROLAC TROMETHAMINE 30 MG/ML IJ SOLN
INTRAMUSCULAR | Status: AC
  Filled 2021-04-13: qty 1

## 2021-04-13 MED ORDER — KETOROLAC TROMETHAMINE 30 MG/ML IJ SOLN
30.0000 mg | Freq: Once | INTRAMUSCULAR | Status: AC
  Administered 2021-04-13: 30 mg via INTRAMUSCULAR

## 2021-04-13 NOTE — ED Notes (Addendum)
Pt stickers left on my computer with gone on back

## 2021-04-13 NOTE — ED Provider Notes (Signed)
[facility]    CSN: [id number] Arrival date & time: 04/13/21  2774      History   Chief Complaint Chief Complaint  Patient presents with   Dental Pain    HPI Chad Willis. is a 25 y.o. male.   Patient presents today with a 1 day history of left lower jaw pain/swelling.  He reports pain is rated 10 on a 0-10 pain scale, localized to left lower molar, described as intense aching with periodic sharp pains, no aggravating alleviating factors identified.  He denies any injury or broken tooth in this area.  He has not seen a dentist recently but did schedule an appointment to see someone in the next few days.  He denies any swelling of his throat/mouth, difficulty speaking, muffled voice, difficulty swallowing.  Denies any recent antibiotic use.  He denies associated fever, headache, nausea, vomiting, body aches.  He has tried Tylenol, Motrin, Orajel with minimal improvement of symptoms.   History reviewed. No pertinent past medical history.  Patient Active Problem List   Diagnosis Date Noted   Acute appendicitis 03/01/2018    Past Surgical History:  Procedure Laterality Date   KNEE ARTHROSCOPY WITH ANTERIOR CRUCIATE LIGAMENT (ACL) REPAIR Left    LAPAROSCOPIC APPENDECTOMY N/A 03/01/2018   Procedure: APPENDECTOMY LAPAROSCOPIC;  Surgeon: Abigail Miyamoto, MD;  Location: [facility] OR;  Service: General;  Laterality: N/A;       Home Medications    Prior to Admission medications   Medication Sig Start Date End Date Taking? Authorizing Provider  clindamycin (CLEOCIN) 300 MG capsule Take 1 capsule (300 mg total) by mouth 3 (three) times daily. 04/13/21  Yes Mignon Bechler K, PA-C  fexofenadine (ALLEGRA) 180 MG tablet Take 180 mg by mouth daily as needed for allergies or rhinitis.    [provider]    Family History History reviewed. No pertinent family history.  Social History Social History   Tobacco Use   Smoking status: Never   Smokeless tobacco:  Never  Vaping Use   Vaping Use: Never used  Substance Use Topics   Alcohol use: Yes    Comment: occasional   Drug use: Never     Allergies   Amoxicillin   Review of Systems Review of Systems  Constitutional:  Positive for activity change and appetite change. Negative for fatigue and fever.  HENT:  Positive for dental problem. Negative for congestion, sinus pressure, sneezing and sore throat.   Respiratory:  Negative for cough and shortness of breath.   Cardiovascular:  Negative for chest pain.  Gastrointestinal:  Negative for abdominal pain, diarrhea, nausea and vomiting.  Neurological:  Negative for dizziness, light-headedness and headaches.    Physical Exam Triage Vital Signs ED Triage Vitals  Enc Vitals Group     BP 04/13/21 0958 132/79     Pulse Rate 04/13/21 0958 73     Resp 04/13/21 0958 18     Temp 04/13/21 0958 99.3 F (37.4 C)     Temp Source 04/13/21 0958 Oral     SpO2 04/13/21 0958 97 %     Weight --      Height --      Head Circumference --      Peak Flow --      Pain Score 04/13/21 0959 9     Pain Loc --      Pain Edu? --      Excl. in GC? --    No data found.  Updated Vital  Signs BP 132/79 (BP Location: Left Arm)   Pulse 73   Temp 99.3 F (37.4 C) (Oral)   Resp 18   SpO2 97%   Visual Acuity Right Eye Distance:   Left Eye Distance:   Bilateral Distance:    Right Eye Near:   Left Eye Near:    Bilateral Near:     Physical Exam Vitals reviewed.  Constitutional:      General: He is awake.     Appearance: Normal appearance. He is well-developed. He is not ill-appearing.     Comments: Very pleasant male appears stated age in no acute distress sitting comfortably in exam room  HENT:     Head: Normocephalic and atraumatic.     Right Ear: External ear normal.     Left Ear: External ear normal.     Nose: Nose normal.     Mouth/Throat:     Dentition: Abnormal dentition. Dental tenderness, gingival swelling and dental abscesses present.      Pharynx: Uvula midline. No oropharyngeal exudate or posterior oropharyngeal erythema.      Comments: No evidence of Ludwig angina Cardiovascular:     Rate and Rhythm: Normal rate and regular rhythm.     Heart sounds: Normal heart sounds, S1 normal and S2 normal. No murmur heard. Pulmonary:     Effort: Pulmonary effort is normal. No accessory muscle usage or respiratory distress.     Breath sounds: Normal breath sounds. No stridor. No wheezing, rhonchi or rales.     Comments: Clear to auscultation bilaterally Abdominal:     General: Bowel sounds are normal.     Palpations: Abdomen is soft.     Tenderness: There is no abdominal tenderness.  Neurological:     Mental Status: He is alert.  Psychiatric:        Behavior: Behavior is cooperative.     UC Treatments / Results  Labs (all labs ordered are listed, but only abnormal results are displayed) Labs Reviewed - No data to display  EKG   Radiology No results found.  Procedures Procedures (including critical care time)  Medications Ordered in UC Medications  ketorolac (TORADOL) 30 MG/ML injection 30 mg (has no administration in time range)    Initial Impression / Assessment and Plan / UC Course  I have reviewed the triage vital signs and the nursing notes.  Pertinent labs & imaging results that were available during my care of the patient were reviewed by me and considered in my medical decision making (see chart for details).     Vital signs physical exam reassuring today; no indication for emergent evaluation or imaging.  Dental infection noted on exam.  Patient was started on clindamycin given amoxicillin allergy.  He was given Toradol in clinic to help with pain with instruction to take NSAIDs for 24 hours.  He can use Tylenol for the next 24 hours and then alternate NSAIDs and Tylenol thereafter.  Discussed that he needs to see a dentist as you will likely have recurrent symptoms until underlying tooth issue is  addressed.  Discussed alarm symptoms that warrant emergent evaluation.  Strict return precautions given to which he expressed understanding.  Final Clinical Impressions(s) / UC Diagnoses   Final diagnoses:  Dental infection  Pain, dental     Discharge Instructions      We gave you a shot of Toradol today.  This is like ibuprofen so do not take any NSAIDs including ibuprofen, aspirin, Aleve for 24 hours.  You can  use Tylenol for breakthrough pain.  After the 24 hours he can alternate NSAIDs and Tylenol for pain relief.  Start clindamycin 3 times a day.  This can upset your stomach so take it with food.  As we discussed, it is important follow-up with dentist as you will likely have recurrent symptoms until underlying tooth issue is addressed.  If you have any worsening symptoms including swelling of your mouth/tongue, difficulty speaking, difficulty swallowing, shortness of breath you must go to the emergency room.     ED Prescriptions     Medication Sig Dispense Auth. Provider   clindamycin (CLEOCIN) 300 MG capsule Take 1 capsule (300 mg total) by mouth 3 (three) times daily. 30 capsule Dalesha Stanback K, PA-C      PDMP not reviewed this encounter.   Jeani Hawking, PA-C 04/13/21 1056

## 2021-04-13 NOTE — ED Triage Notes (Signed)
Pt. Stated, I have a bad tooth that started hurting last night.

## 2021-04-13 NOTE — ED Triage Notes (Signed)
Pt c/o lt lower toothache with mild swelling since last night. States taking tylenol, motrin, and using Orajel with little relief.

## 2021-04-13 NOTE — Discharge Instructions (Signed)
We gave you a shot of Toradol today.  This is like ibuprofen so do not take any NSAIDs including ibuprofen, aspirin, Aleve for 24 hours.  You can use Tylenol for breakthrough pain.  After the 24 hours he can alternate NSAIDs and Tylenol for pain relief.  Start clindamycin 3 times a day.  This can upset your stomach so take it with food.  As we discussed, it is important follow-up with dentist as you will likely have recurrent symptoms until underlying tooth issue is addressed.  If you have any worsening symptoms including swelling of your mouth/tongue, difficulty speaking, difficulty swallowing, shortness of breath you must go to the emergency room.

## 2021-04-15 ENCOUNTER — Emergency Department (HOSPITAL_BASED_OUTPATIENT_CLINIC_OR_DEPARTMENT_OTHER)
Admission: EM | Admit: 2021-04-15 | Discharge: 2021-04-15 | Disposition: A | Discharge status: Home / Self Care | Payer: BC Managed Care – PPO | Attending: Emergency Medicine | Admitting: Emergency Medicine

## 2021-04-15 ENCOUNTER — Other Ambulatory Visit: Payer: Self-pay

## 2021-04-15 ENCOUNTER — Encounter (HOSPITAL_BASED_OUTPATIENT_CLINIC_OR_DEPARTMENT_OTHER): Payer: Self-pay

## 2021-04-15 DIAGNOSIS — J101 Influenza due to other identified influenza virus with other respiratory manifestations: Secondary | ICD-10-CM

## 2021-04-15 DIAGNOSIS — R059 Cough, unspecified: Secondary | ICD-10-CM | POA: Diagnosis not present

## 2021-04-15 DIAGNOSIS — Z20822 Contact with and (suspected) exposure to covid-19: Secondary | ICD-10-CM | POA: Insufficient documentation

## 2021-04-15 LAB — RESP PANEL BY RT-PCR (FLU A&B, COVID) ARPGX2
Influenza A by PCR: POSITIVE — AB
Influenza B by PCR: NEGATIVE
SARS Coronavirus 2 by RT PCR: NEGATIVE

## 2021-04-15 NOTE — ED Triage Notes (Signed)
Pt arrives with c/o cough, fatigue, and brain fog since yesterday. Pt reports cough is productive.

## 2021-04-15 NOTE — ED Provider Notes (Signed)
[facility] EMERGENCY DEPT Provider Note   CSN: [id number] Arrival date & time: 04/15/21  1801     History Chief Complaint  Patient presents with   Cough    Chad Willis. is a 25 y.o. male.  The history is provided by the patient.  Cough Cough characteristics:  Non-productive Sputum characteristics:  Nondescript Severity:  Moderate Onset quality:  Gradual Timing:  Intermittent Progression:  Waxing and waning Chronicity:  New Smoker: yes   Context: upper respiratory infection   Associated symptoms: no chest pain, no chills, no ear pain, no fever, no rash, no shortness of breath and no sore throat       History reviewed. No pertinent past medical history.  Patient Active Problem List   Diagnosis Date Noted   Acute appendicitis 03/01/2018    Past Surgical History:  Procedure Laterality Date   KNEE ARTHROSCOPY WITH ANTERIOR CRUCIATE LIGAMENT (ACL) REPAIR Left    LAPAROSCOPIC APPENDECTOMY N/A 03/01/2018   Procedure: APPENDECTOMY LAPAROSCOPIC;  Surgeon: Coralie Keens, MD;  Location: [facility];  Service: General;  Laterality: N/A;       No family history on file.  Social History   Tobacco Use   Smoking status: Never   Smokeless tobacco: Never  Vaping Use   Vaping Use: Never used  Substance Use Topics   Alcohol use: Yes    Comment: occasional   Drug use: Never    Home Medications Prior to Admission medications   Medication Sig Start Date End Date Taking? Authorizing Provider  clindamycin (CLEOCIN) 300 MG capsule Take 1 capsule (300 mg total) by mouth 3 (three) times daily. 04/13/21   Raspet, Derry Skill, PA-C  fexofenadine (ALLEGRA) 180 MG tablet Take 180 mg by mouth daily as needed for allergies or rhinitis.    [provider]    Allergies    Amoxicillin  Review of Systems   Review of Systems  Constitutional:  Negative for chills and fever.  HENT:  Negative for ear pain and sore throat.   Eyes:  Negative for pain and  visual disturbance.  Respiratory:  Positive for cough. Negative for shortness of breath.   Cardiovascular:  Negative for chest pain and palpitations.  Gastrointestinal:  Negative for abdominal pain and vomiting.  Genitourinary:  Negative for dysuria and hematuria.  Musculoskeletal:  Negative for arthralgias and back pain.  Skin:  Negative for color change and rash.  Neurological:  Negative for seizures and syncope.  All other systems reviewed and are negative.  Physical Exam Updated Vital Signs BP (!) 148/89 (BP Location: Right Arm)   Pulse 92   Temp 99.3 F (37.4 C)   Resp 16   Ht 6' (1.829 m)   Wt 117.9 kg   SpO2 100%   BMI 35.26 kg/m   Physical Exam Vitals and nursing note reviewed.  Constitutional:      General: He is not in acute distress.    Appearance: He is well-developed.  HENT:     Head: Normocephalic and atraumatic.  Eyes:     Conjunctiva/sclera: Conjunctivae normal.  Cardiovascular:     Rate and Rhythm: Normal rate and regular rhythm.     Heart sounds: No murmur heard. Pulmonary:     Effort: Pulmonary effort is normal. No respiratory distress.     Breath sounds: Normal breath sounds.  Abdominal:     Palpations: Abdomen is soft.     Tenderness: There is no abdominal tenderness.  Musculoskeletal:  General: No swelling.     Cervical back: Neck supple.  Skin:    General: Skin is warm and dry.     Capillary Refill: Capillary refill takes less than 2 seconds.  Neurological:     Mental Status: He is alert.  Psychiatric:        Mood and Affect: Mood normal.    ED Results / Procedures / Treatments   Labs (all labs ordered are listed, but only abnormal results are displayed) Labs Reviewed  RESP PANEL BY RT-PCR (FLU A&B, COVID) ARPGX2 - Abnormal; Notable for the following components:      Result Value   Influenza A by PCR POSITIVE (*)    All other components within normal limits    EKG None  Radiology No results  found.  Procedures Procedures   Medications Ordered in ED Medications - No data to display  ED Course  I have reviewed the triage vital signs and the nursing notes.  Pertinent labs & imaging results that were available during my care of the patient were reviewed by me and considered in my medical decision making (see chart for details).    MDM Rules/Calculators/A&P                           Chad Steffanie Dunn. is here with cough and congestion, low-grade fever.  Positive for influenza A.  Very well-appearing.  Symptoms for 2 days.  She had decision not to use Tamiflu.  Very minimal symptoms.  Recommend Tylenol and ibuprofen.  Discharged in good condition.  Understands return precautions.  This chart was dictated using voice recognition software.  Despite best efforts to proofread,  errors can occur which can change the documentation meaning.   Final Clinical Impression(s) / ED Diagnoses Final diagnoses:  Influenza A    Rx / DC Orders ED Discharge Orders     None        Virgina Norfolk, DO 04/15/21 2015

## 2021-05-09 ENCOUNTER — Ambulatory Visit: Payer: Medicaid Other | Admitting: Family

## 2021-05-24 ENCOUNTER — Ambulatory Visit: Payer: Self-pay | Admitting: Nurse Practitioner

## 2021-08-21 DIAGNOSIS — Z114 Encounter for screening for human immunodeficiency virus [HIV]: Secondary | ICD-10-CM | POA: Diagnosis not present

## 2021-08-21 DIAGNOSIS — Z113 Encounter for screening for infections with a predominantly sexual mode of transmission: Secondary | ICD-10-CM | POA: Diagnosis not present

## 2021-09-20 DIAGNOSIS — Z113 Encounter for screening for infections with a predominantly sexual mode of transmission: Secondary | ICD-10-CM | POA: Diagnosis not present

## 2021-09-20 DIAGNOSIS — Z114 Encounter for screening for human immunodeficiency virus [HIV]: Secondary | ICD-10-CM | POA: Diagnosis not present

## 2021-11-21 ENCOUNTER — Other Ambulatory Visit: Payer: Self-pay

## 2021-11-21 ENCOUNTER — Encounter (HOSPITAL_COMMUNITY): Payer: Self-pay | Admitting: Emergency Medicine

## 2021-11-21 ENCOUNTER — Emergency Department (HOSPITAL_COMMUNITY): Payer: BC Managed Care – PPO

## 2021-11-21 ENCOUNTER — Emergency Department (HOSPITAL_COMMUNITY)
Admission: EM | Admit: 2021-11-21 | Discharge: 2021-11-22 | Disposition: A | Discharge status: Home / Self Care | Payer: BC Managed Care – PPO | Attending: Emergency Medicine | Admitting: Emergency Medicine

## 2021-11-21 DIAGNOSIS — R0781 Pleurodynia: Secondary | ICD-10-CM | POA: Insufficient documentation

## 2021-11-21 DIAGNOSIS — R0600 Dyspnea, unspecified: Secondary | ICD-10-CM | POA: Diagnosis not present

## 2021-11-21 DIAGNOSIS — R0602 Shortness of breath: Secondary | ICD-10-CM | POA: Diagnosis not present

## 2021-11-21 DIAGNOSIS — K2971 Gastritis, unspecified, with bleeding: Secondary | ICD-10-CM | POA: Diagnosis not present

## 2021-11-21 DIAGNOSIS — R000 Tachycardia, unspecified: Secondary | ICD-10-CM | POA: Insufficient documentation

## 2021-11-21 DIAGNOSIS — R079 Chest pain, unspecified: Secondary | ICD-10-CM | POA: Diagnosis not present

## 2021-11-21 DIAGNOSIS — R1013 Epigastric pain: Secondary | ICD-10-CM | POA: Diagnosis not present

## 2021-11-21 DIAGNOSIS — K2970 Gastritis, unspecified, without bleeding: Secondary | ICD-10-CM | POA: Diagnosis not present

## 2021-11-21 DIAGNOSIS — R7989 Other specified abnormal findings of blood chemistry: Secondary | ICD-10-CM | POA: Diagnosis not present

## 2021-11-21 LAB — CBC WITH DIFFERENTIAL/PLATELET
Abs Immature Granulocytes: 0.02 10*3/uL (ref 0.00–0.07)
Basophils Absolute: 0 10*3/uL (ref 0.0–0.1)
Basophils Relative: 1 %
Eosinophils Absolute: 0.2 10*3/uL (ref 0.0–0.5)
Eosinophils Relative: 5 %
HCT: 45.8 % (ref 39.0–52.0)
Hemoglobin: 14.4 g/dL (ref 13.0–17.0)
Immature Granulocytes: 0 %
Lymphocytes Relative: 44 %
Lymphs Abs: 2.3 10*3/uL (ref 0.7–4.0)
MCH: 28.2 pg (ref 26.0–34.0)
MCHC: 31.4 g/dL (ref 30.0–36.0)
MCV: 89.6 fL (ref 80.0–100.0)
Monocytes Absolute: 0.3 10*3/uL (ref 0.1–1.0)
Monocytes Relative: 6 %
Neutro Abs: 2.2 10*3/uL (ref 1.7–7.7)
Neutrophils Relative %: 44 %
Platelets: 334 10*3/uL (ref 150–400)
RBC: 5.11 MIL/uL (ref 4.22–5.81)
RDW: 13.8 % (ref 11.5–15.5)
WBC: 5.1 10*3/uL (ref 4.0–10.5)
nRBC: 0 % (ref 0.0–0.2)

## 2021-11-21 LAB — BASIC METABOLIC PANEL
Anion gap: 9 (ref 5–15)
BUN: 6 mg/dL (ref 6–20)
CO2: 25 mmol/L (ref 22–32)
Calcium: 9.6 mg/dL (ref 8.9–10.3)
Chloride: 108 mmol/L (ref 98–111)
Creatinine, Ser: 1.09 mg/dL (ref 0.61–1.24)
GFR, Estimated: >60 mL/min (ref >60)
Glucose, Bld: 87 mg/dL (ref 70–99)
Potassium: 3.9 mmol/L (ref 3.5–5.1)
Sodium: 142 mmol/L (ref 135–145)

## 2021-11-21 LAB — D-DIMER, QUANTITATIVE: D-Dimer, Quant: 0.58 ug/mL-FEU — ABNORMAL HIGH (ref 0.00–0.50)

## 2021-11-21 NOTE — ED Provider Triage Note (Signed)
Emergency Medicine Provider Triage Evaluation Note  [name]. , a 26 y.o. male  was evaluated in triage.  Pt complains of sob. Report feeling sob nearly a week.  Today he spit up some blood.  Denies cp, fever, wheeze, abd pain, nausea.  No prior hx of PE/DVT, not a smoker  Review of Systems  Positive: As above Negative: As above  Physical Exam  There were no vitals taken for this visit. Gen:   Awake, no distress   Resp:  Normal effort  MSK:   Moves extremities without difficulty  Other:    Medical Decision Making  Medically screening exam initiated at 8:07 PM.  Appropriate orders placed.  [name]. was informed that the remainder of the evaluation will be completed by another provider, this initial triage assessment does not replace that evaluation, and the importance of remaining in the ED until their evaluation is complete.     Fayrene Helper, PA-C 11/21/21 2010

## 2021-11-21 NOTE — ED Triage Notes (Signed)
Patient reports SOB this weeks , vomited this afternoon with bloody emesis . No fever or diarrhea .

## 2021-11-22 ENCOUNTER — Emergency Department (HOSPITAL_COMMUNITY): Payer: BC Managed Care – PPO

## 2021-11-22 DIAGNOSIS — R7989 Other specified abnormal findings of blood chemistry: Secondary | ICD-10-CM | POA: Diagnosis not present

## 2021-11-22 MED ORDER — IOHEXOL 350 MG/ML SOLN
75.0000 mL | Freq: Once | INTRAVENOUS | Status: AC | PRN
  Administered 2021-11-22: 75 mL via INTRAVENOUS

## 2021-11-22 MED ORDER — PANTOPRAZOLE SODIUM 40 MG PO TBEC
40.0000 mg | DELAYED_RELEASE_TABLET | Freq: Every day | ORAL | Status: DC
  Administered 2021-11-22: 40 mg via ORAL
  Filled 2021-11-22: qty 1

## 2021-11-22 MED ORDER — ONDANSETRON 4 MG PO TBDP: ORAL_TABLET | ORAL | 0 refills | Status: DC

## 2021-11-22 MED ORDER — ONDANSETRON 4 MG PO TBDP
8.0000 mg | ORAL_TABLET | Freq: Once | ORAL | Status: AC
  Administered 2021-11-22: 8 mg via ORAL
  Filled 2021-11-22: qty 2

## 2021-11-22 MED ORDER — PANTOPRAZOLE SODIUM 20 MG PO TBEC: 40.0000 mg | DELAYED_RELEASE_TABLET | Freq: Every day | ORAL | 0 refills | Status: DC

## 2021-11-23 NOTE — ED Provider Notes (Signed)
[facility] EMERGENCY DEPARTMENT Provider Note   CSN: [id number] Arrival date & time: 11/21/21  1942     History  Chief Complaint  Patient presents with   SOB / Hematemesis     Chad Willis. is a 26 y.o. male.  Patient states he vomited earlier today and had some blood streaking. Has had some epigastric pain prior to this. No further emesis or bleeding. States he has been sob but not regularly. Does have some pleuritic chest pain. No fevers. No recent illnesses.         Home Medications Prior to Admission medications   Medication Sig Start Date End Date Taking? Authorizing Provider  ondansetron (ZOFRAN-ODT) 4 MG disintegrating tablet 4mg  ODT q4 hours prn nausea/vomit 11/22/21  Yes Brysten Reister, 11/24/21, MD  pantoprazole (PROTONIX) 20 MG tablet Take 2 tablets (40 mg total) by mouth daily for 14 days. 11/22/21 12/06/21 Yes Radonna Bracher, 12/08/21, MD  clindamycin (CLEOCIN) 300 MG capsule Take 1 capsule (300 mg total) by mouth 3 (three) times daily. 04/13/21   Raspet, 04/15/21, PA-C  fexofenadine (ALLEGRA) 180 MG tablet Take 180 mg by mouth daily as needed for allergies or rhinitis.    [provider]      Allergies    Amoxicillin    Review of Systems   Review of Systems  Physical Exam Updated Vital Signs BP 105/70   Pulse (!) 47   Temp (!) 97.4 F (36.3 C)   Resp 16   SpO2 98%  Physical Exam Vitals and nursing note reviewed.  Constitutional:      Appearance: He is well-developed.  HENT:     Head: Normocephalic and atraumatic.     Mouth/Throat:     Mouth: Mucous membranes are moist.     Pharynx: Oropharynx is clear.  Eyes:     Pupils: Pupils are equal, round, and reactive to light.  Cardiovascular:     Rate and Rhythm: Normal rate.  Pulmonary:     Effort: Pulmonary effort is normal. No respiratory distress.  Abdominal:     General: Abdomen is flat. There is no distension.  Musculoskeletal:        General: Normal range of motion.      Cervical back: Normal range of motion.  Skin:    General: Skin is warm and dry.  Neurological:     General: No focal deficit present.     Mental Status: He is alert.     ED Results / Procedures / Treatments   Labs (all labs ordered are listed, but only abnormal results are displayed) Labs Reviewed  D-DIMER, QUANTITATIVE - Abnormal; Notable for the following components:      Result Value   D-Dimer, Quant 0.58 (*)    All other components within normal limits  BASIC METABOLIC PANEL  CBC WITH DIFFERENTIAL/PLATELET    EKG EKG Interpretation  Date/Time:  Wednesday November 21 2021 20:08:34 EDT Ventricular Rate:  76 PR Interval:  168 QRS Duration: 78 QT Interval:  356 QTC Calculation: 400 R Axis:   47 Text Interpretation: Normal sinus rhythm with sinus arrhythmia Nonspecific T wave abnormality Abnormal ECG When compared with ECG of 28-Jul-2018 00:48, PREVIOUS ECG IS PRESENT Confirmed by 30-Jul-2018 [phone]) on 11/22/2021 3:48:15 AM  Radiology CT Angio Chest PE W and/or Wo Contrast  Result Date: 11/22/2021 CLINICAL DATA:  POSITIVE D-dimer.  SOB/Hematemesis EXAM: CT ANGIOGRAPHY CHEST WITH CONTRAST TECHNIQUE: Multidetector CT imaging of the chest was performed using the standard protocol during  bolus administration of intravenous contrast. Multiplanar CT image reconstructions and MIPs were obtained to evaluate the vascular anatomy. RADIATION DOSE REDUCTION: This exam was performed according to the departmental dose-optimization program which includes automated exposure control, adjustment of the mA and/or kV according to patient size and/or use of iterative reconstruction technique. CONTRAST:  36mL OMNIPAQUE IOHEXOL 350 MG/ML SOLN COMPARISON:  Chest XR, 11/21/2021. FINDINGS: Cardiovascular: Satisfactory opacification of the pulmonary arteries to the segmental level. No evidence of segmental or larger pulmonary embolism. Normal heart size. No pericardial effusion. Mediastinum/Nodes: No enlarged  mediastinal, hilar, or axillary lymph nodes. Thyroid gland, trachea, and esophagus demonstrate no significant findings. Lungs/Pleura: Sub-6 mm RIGHT lower lobe perifissural nodularity along major fissure, likely an intrapulmonary lymph node. Lungs are otherwise clear without focal consolidation or mass. No pleural effusion or pneumothorax. Upper Abdomen: No acute abnormality. Musculoskeletal: No chest wall abnormality. No acute or significant osseous findings. Review of the MIP images confirms the above findings. No follow-up is indicated for incidental findings, unless specifically mentioned. IMPRESSION: 1. No segmental or larger pulmonary embolus. 2. No acute intrathoracic abnormality. Electronically Signed   By: Roanna Banning M.D.   On: 11/22/2021 04:44   DG Chest 2 View  Result Date: 11/21/2021 CLINICAL DATA:  Dyspnea chest pain EXAM: CHEST - 2 VIEW COMPARISON:  None Available. FINDINGS: The heart size and mediastinal contours are within normal limits. Both lungs are clear. The visualized skeletal structures are unremarkable. IMPRESSION: No active cardiopulmonary disease. Electronically Signed   By: Helyn Numbers M.D.   On: 11/21/2021 20:25    Procedures Procedures    Medications Ordered in ED Medications  iohexol (OMNIPAQUE) 350 MG/ML injection 75 mL (75 mLs Intravenous Contrast Given 11/22/21 0428)  ondansetron (ZOFRAN-ODT) disintegrating tablet 8 mg (8 mg Oral Given 11/22/21 0547)    ED Course/ Medical Decision Making/ A&P                           Medical Decision Making Amount and/or Complexity of Data Reviewed Radiology: ordered.  Risk Prescription drug management.   PE study doen for pleuritic pain, initial tachycardia and positive d dimer but negative. Suspect patietn likely has gastritis or some equivalent. Stable for d/c with outpatient follow up.   Final Clinical Impression(s) / ED Diagnoses Final diagnoses:  Gastritis with hemorrhage, unspecified chronicity, unspecified  gastritis type    Rx / DC Orders ED Discharge Orders          Ordered    ondansetron (ZOFRAN-ODT) 4 MG disintegrating tablet        11/22/21 0535    pantoprazole (PROTONIX) 20 MG tablet  Daily        11/22/21 0535              Catalyna Reilly, Barbara Cower, MD 11/23/21 [phone]

## 2021-11-26 ENCOUNTER — Ambulatory Visit: Payer: BC Managed Care – PPO | Admitting: Nurse Practitioner

## 2021-11-26 ENCOUNTER — Encounter: Payer: Self-pay | Admitting: Nurse Practitioner

## 2021-11-26 VITALS — BP 120/72 | HR 57 | Temp 97.3°F | Wt 232.4 lb

## 2021-11-26 DIAGNOSIS — K580 Irritable bowel syndrome with diarrhea: Secondary | ICD-10-CM

## 2021-11-26 DIAGNOSIS — R0789 Other chest pain: Secondary | ICD-10-CM | POA: Diagnosis not present

## 2021-11-26 NOTE — Assessment & Plan Note (Signed)
He went to the ER for this on 11/21/2021.  He had a chest x-ray, EKG, CT angio which were all normal.  It has improved over the past few days.  BMP and CBC were negative.  He does endorse tenderness with palpation of chest near sternum.  We will have him start Voltaren gel 4 times a day as needed for pain.  Discussed that once his stomach has gotten better, he can start ibuprofen or naproxen as needed for pain.  Follow-up if symptoms worsen or any concerns.

## 2021-11-26 NOTE — Progress Notes (Signed)
New Patient Office Visit  Subjective    Patient ID: Chad Willis., male    DOB: Dec 03, 1995  Age: 26 y.o. MRN: [id number]  CC:  Chief Complaint  Patient presents with   Establish Care    Est care. Np. Hosp f/u concerns.    HPI [name]. presents for new patient visit to establish care.  Introduced to Publishing rights manager role and practice setting.  All questions answered.  Discussed provider/patient relationship and expectations.  He was recent in the ER on 11/21/21 for pleuritic chest pain, nausea, and vomiting. He has d-dimer which was positive, however a negative CT angio for PE. He was discharged with gastritis and given zofran and pantoprazole.   Since he went to the ER, his symptoms have improved. He still has intermittent tightness near his sternum. His nausea and abdomen pain have resolved. He states that he was also told he has IBS. He endorses intermittent diarrhea. He would like a referral to GI.   Depression and Anxiety Screen Done:     11/26/2021    3:20 PM  Depression screen PHQ 2/9  Decreased Interest 1  Down, Depressed, Hopeless 1  PHQ - 2 Score 2  Altered sleeping 1  Tired, decreased energy 2  Change in appetite 2  Feeling bad or failure about yourself  1  Trouble concentrating 0  Moving slowly or fidgety/restless 0  Suicidal thoughts 0  PHQ-9 Score 8  Difficult doing work/chores Somewhat difficult      11/26/2021    3:20 PM  GAD 7 : Generalized Anxiety Score  Nervous, Anxious, on Edge 1  Control/stop worrying 1  Worry too much - different things 1  Trouble relaxing 1  Restless 1  Easily annoyed or irritable 1  Afraid - awful might happen 0  Total GAD 7 Score 6  Anxiety Difficulty Not difficult at all    Outpatient Encounter Medications as of 11/26/2021  Medication Sig   ondansetron (ZOFRAN-ODT) 4 MG disintegrating tablet 4mg  ODT q4 hours prn nausea/vomit   pantoprazole (PROTONIX) 20 MG tablet Take 2 tablets (40 mg total) by  mouth daily for 14 days.   [DISCONTINUED] clindamycin (CLEOCIN) 300 MG capsule Take 1 capsule (300 mg total) by mouth 3 (three) times daily.   [DISCONTINUED] fexofenadine (ALLEGRA) 180 MG tablet Take 180 mg by mouth daily as needed for allergies or rhinitis.   No facility-administered encounter medications on file as of 11/26/2021.    Past Medical History:  Diagnosis Date   Gastritis    IBS (irritable bowel syndrome)     Past Surgical History:  Procedure Laterality Date   KNEE ARTHROSCOPY WITH ANTERIOR CRUCIATE LIGAMENT (ACL) REPAIR Left    LAPAROSCOPIC APPENDECTOMY N/A 03/01/2018   Procedure: APPENDECTOMY LAPAROSCOPIC;  Surgeon: 05/01/2018, MD;  Location: [facility] OR;  Service: General;  Laterality: N/A;    Family History  Problem Relation Age of Onset   Cancer Mother        breast   Diabetes Mother    Hypertension Mother    Healthy Father    Cancer Maternal Aunt        breast   Cancer Paternal Aunt        breast   Hypertension Maternal Grandmother    Diabetes Maternal Grandmother    Hypertension Maternal Grandfather    Diabetes Maternal Grandfather    Hypertension Paternal Grandmother    Diabetes Paternal Grandmother    Hypertension Paternal Grandfather    Diabetes Paternal  Grandfather     Social History   Socioeconomic History   Marital status: Single    Spouse name: Not on file   Number of children: Not on file   Years of education: Not on file   Highest education level: Not on file  Occupational History   Not on file  Tobacco Use   Smoking status: Never   Smokeless tobacco: Never  Vaping Use   Vaping Use: Never used  Substance and Sexual Activity   Alcohol use: Yes    Comment: occasional   Drug use: Never   Sexual activity: Yes    Birth control/protection: Condom  Other Topics Concern   Not on file  Social History Narrative   Not on file   Social Determinants of Health   Financial Resource Strain: Not on file  Food Insecurity: Not on file   Transportation Needs: Not on file  Physical Activity: Not on file  Stress: Not on file  Social Connections: Not on file  Intimate Partner Violence: Not on file    Review of Systems  Constitutional:  Positive for malaise/fatigue. Negative for fever.  HENT: Negative.    Eyes:  Positive for blurred vision (intermittently).  Respiratory:  Negative for cough and shortness of breath.        Chest tightness   Cardiovascular: Negative.   Gastrointestinal: Negative.   Genitourinary: Negative.   Musculoskeletal:  Positive for back pain (upper back intermittently after activity).  Skin: Negative.   Neurological:  Positive for dizziness (when squatting or changing positions quickly). Negative for headaches.  Endo/Heme/Allergies:  Positive for environmental allergies.  Psychiatric/Behavioral:  Positive for depression (at times). The patient is nervous/anxious (at times).      Objective    BP 120/72   Pulse (!) 57   Temp (!) 97.3 F (36.3 C) (Temporal)   Wt 232 lb 6.4 oz (105.4 kg)   SpO2 95%   BMI 31.52 kg/m   Physical Exam Vitals and nursing note reviewed.  Constitutional:      General: He is not in acute distress.    Appearance: Normal appearance.  HENT:     Head: Normocephalic and atraumatic.     Right Ear: Tympanic membrane, ear canal and external ear normal.     Left Ear: Tympanic membrane, ear canal and external ear normal.  Eyes:     Conjunctiva/sclera: Conjunctivae normal.  Cardiovascular:     Rate and Rhythm: Normal rate and regular rhythm.     Pulses: Normal pulses.     Heart sounds: Normal heart sounds.  Pulmonary:     Effort: Pulmonary effort is normal.     Breath sounds: Normal breath sounds.  Abdominal:     General: There is no distension.     Palpations: Abdomen is soft.     Tenderness: There is no abdominal tenderness. There is no guarding.  Musculoskeletal:        General: Normal range of motion.     Cervical back: Normal range of motion and neck  supple. No tenderness.  Lymphadenopathy:     Cervical: No cervical adenopathy.  Skin:    General: Skin is warm and dry.  Neurological:     General: No focal deficit present.     Mental Status: He is alert and oriented to person, place, and time.  Psychiatric:        Mood and Affect: Mood normal.        Behavior: Behavior normal.  Thought Content: Thought content normal.        Judgment: Judgment normal.    Last CBC Lab Results  Component Value Date   WBC 5.1 11/21/2021   HGB 14.4 11/21/2021   HCT 45.8 11/21/2021   MCV 89.6 11/21/2021   MCH 28.2 11/21/2021   RDW 13.8 11/21/2021   PLT 334 [id number]   Last metabolic panel Lab Results  Component Value Date   GLUCOSE 87 11/21/2021   NA 142 11/21/2021   K 3.9 11/21/2021   CL 108 11/21/2021   CO2 25 11/21/2021   BUN 6 11/21/2021   CREATININE 1.09 11/21/2021   GFRNONAA >60 11/21/2021   CALCIUM 9.6 11/21/2021   PROT 7.1 08/29/2019   ALBUMIN 4.0 08/29/2019   BILITOT 1.2 08/29/2019   ALKPHOS 67 08/29/2019   AST 20 08/29/2019   ALT 21 08/29/2019   ANIONGAP 9 11/21/2021        Assessment & Plan:   Problem List Items Addressed This Visit       Digestive   Irritable bowel syndrome with diarrhea - Primary    Chronic, ongoing.  He states that he intermittently will have frequent stools.  Discussed eating foods high in fiber and drinking plenty of water.  He can start a fiber supplement if he does not get enough fiber with food alone.  We will place referral to GI per patient request.      Relevant Orders   Ambulatory referral to Gastroenterology     Other   Chest wall pain    He went to the ER for this on 11/21/2021.  He had a chest x-ray, EKG, CT angio which were all normal.  It has improved over the past few days.  BMP and CBC were negative.  He does endorse tenderness with palpation of chest near sternum.  We will have him start Voltaren gel 4 times a day as needed for pain.  Discussed that once his stomach has  gotten better, he can start ibuprofen or naproxen as needed for pain.  Follow-up if symptoms worsen or any concerns.       Return in about 3 months (around 02/26/2022) for CPE.   Charyl Dancer, NP

## 2021-11-26 NOTE — Assessment & Plan Note (Signed)
Chronic, ongoing.  He states that he intermittently will have frequent stools.  Discussed eating foods high in fiber and drinking plenty of water.  He can start a fiber supplement if he does not get enough fiber with food alone.  We will place referral to GI per patient request.

## 2021-11-26 NOTE — Patient Instructions (Signed)
It was great to see you!  I have placed a referral to GI. I recommend increasing your fiber intake, if you do not get enough with food, you can always take a supplement like benaiber or metamucil. Your goal is to eat 25-30 grams per day. Increase the amount slowly as it can cause upset stomach and bloating if you increase too fast.   Start voltaren gel 4 times a day as needed to your chest and back as needed for pain.   Let's follow-up in 3 months, sooner if you have concerns.  If a referral was placed today, you will be contacted for an appointment. Please note that routine referrals can sometimes take up to 3-4 weeks to process. Please call our office if you haven't heard anything after this time frame.  Take care,  Rodman Pickle, NP

## 2021-12-08 ENCOUNTER — Emergency Department (HOSPITAL_COMMUNITY)
Admission: EM | Admit: 2021-12-08 | Discharge: 2021-12-09 | Disposition: A | Discharge status: Home / Self Care | Payer: BC Managed Care – PPO | Attending: Emergency Medicine | Admitting: Emergency Medicine

## 2021-12-08 ENCOUNTER — Encounter (HOSPITAL_COMMUNITY): Payer: Self-pay

## 2021-12-08 ENCOUNTER — Emergency Department (HOSPITAL_COMMUNITY): Payer: BC Managed Care – PPO

## 2021-12-08 DIAGNOSIS — R0789 Other chest pain: Secondary | ICD-10-CM

## 2021-12-08 DIAGNOSIS — R1110 Vomiting, unspecified: Secondary | ICD-10-CM | POA: Diagnosis not present

## 2021-12-08 DIAGNOSIS — R079 Chest pain, unspecified: Secondary | ICD-10-CM | POA: Diagnosis not present

## 2021-12-08 DIAGNOSIS — R109 Unspecified abdominal pain: Secondary | ICD-10-CM | POA: Diagnosis not present

## 2021-12-08 LAB — CBC
HCT: 41.5 % (ref 39.0–52.0)
Hemoglobin: 13.6 g/dL (ref 13.0–17.0)
MCH: 28.9 pg (ref 26.0–34.0)
MCHC: 32.8 g/dL (ref 30.0–36.0)
MCV: 88.1 fL (ref 80.0–100.0)
Platelets: 348 10*3/uL (ref 150–400)
RBC: 4.71 MIL/uL (ref 4.22–5.81)
RDW: 13.7 % (ref 11.5–15.5)
WBC: 4.9 10*3/uL (ref 4.0–10.5)
nRBC: 0 % (ref 0.0–0.2)

## 2021-12-08 LAB — BASIC METABOLIC PANEL
Anion gap: 12 (ref 5–15)
BUN: 5 mg/dL — ABNORMAL LOW (ref 6–20)
CO2: 23 mmol/L (ref 22–32)
Calcium: 9.5 mg/dL (ref 8.9–10.3)
Chloride: 103 mmol/L (ref 98–111)
Creatinine, Ser: 1.13 mg/dL (ref 0.61–1.24)
GFR, Estimated: >60 mL/min (ref >60)
Glucose, Bld: 93 mg/dL (ref 70–99)
Potassium: 3.2 mmol/L — ABNORMAL LOW (ref 3.5–5.1)
Sodium: 138 mmol/L (ref 135–145)

## 2021-12-08 LAB — TROPONIN I (HIGH SENSITIVITY): Troponin I (High Sensitivity): 3 ng/L (ref <18)

## 2021-12-08 NOTE — ED Triage Notes (Signed)
Pt reports that he has been vomiting all week, twice today with CP and dizziness

## 2021-12-09 ENCOUNTER — Emergency Department (HOSPITAL_COMMUNITY): Payer: BC Managed Care – PPO

## 2021-12-09 DIAGNOSIS — R1110 Vomiting, unspecified: Secondary | ICD-10-CM | POA: Diagnosis not present

## 2021-12-09 DIAGNOSIS — R109 Unspecified abdominal pain: Secondary | ICD-10-CM | POA: Diagnosis not present

## 2021-12-09 LAB — URINALYSIS, ROUTINE W REFLEX MICROSCOPIC
Bilirubin Urine: NEGATIVE
Glucose, UA: NEGATIVE mg/dL
Hgb urine dipstick: NEGATIVE
Ketones, ur: NEGATIVE mg/dL
Leukocytes,Ua: NEGATIVE
Nitrite: NEGATIVE
Protein, ur: NEGATIVE mg/dL
Specific Gravity, Urine: 1.011 (ref 1.005–1.030)
pH: 5 (ref 5.0–8.0)

## 2021-12-09 LAB — HEPATIC FUNCTION PANEL
ALT: 14 U/L (ref 0–44)
AST: 15 U/L (ref 15–41)
Albumin: 4.2 g/dL (ref 3.5–5.0)
Alkaline Phosphatase: 61 U/L (ref 38–126)
Bilirubin, Direct: 0.1 mg/dL (ref 0.0–0.2)
Indirect Bilirubin: 1.4 mg/dL — ABNORMAL HIGH (ref 0.3–0.9)
Total Bilirubin: 1.5 mg/dL — ABNORMAL HIGH (ref 0.3–1.2)
Total Protein: 7.1 g/dL (ref 6.5–8.1)

## 2021-12-09 LAB — RAPID URINE DRUG SCREEN, HOSP PERFORMED
Amphetamines: NOT DETECTED
Barbiturates: NOT DETECTED
Benzodiazepines: NOT DETECTED
Cocaine: NOT DETECTED
Opiates: NOT DETECTED
Tetrahydrocannabinol: NOT DETECTED

## 2021-12-09 LAB — LIPASE, BLOOD: Lipase: 37 U/L (ref 11–51)

## 2021-12-09 LAB — TROPONIN I (HIGH SENSITIVITY): Troponin I (High Sensitivity): 3 ng/L (ref <18)

## 2021-12-09 MED ORDER — PANTOPRAZOLE SODIUM 40 MG PO TBEC: 40.0000 mg | DELAYED_RELEASE_TABLET | Freq: Every day | ORAL | 0 refills | Status: DC

## 2021-12-09 MED ORDER — FAMOTIDINE IN NACL 20-0.9 MG/50ML-% IV SOLN
20.0000 mg | Freq: Once | INTRAVENOUS | Status: AC
  Administered 2021-12-09: 20 mg via INTRAVENOUS
  Filled 2021-12-09: qty 50

## 2021-12-09 MED ORDER — LIDOCAINE VISCOUS HCL 2 % MT SOLN
15.0000 mL | Freq: Once | OROMUCOSAL | Status: AC
  Administered 2021-12-09: 15 mL via ORAL
  Filled 2021-12-09: qty 15

## 2021-12-09 MED ORDER — KETOROLAC TROMETHAMINE 15 MG/ML IJ SOLN
15.0000 mg | Freq: Once | INTRAMUSCULAR | Status: AC
  Administered 2021-12-09: 15 mg via INTRAVENOUS
  Filled 2021-12-09: qty 1

## 2021-12-09 MED ORDER — SUCRALFATE 1 G PO TABS: 1.0000 g | ORAL_TABLET | Freq: Three times a day (TID) | ORAL | 0 refills | Status: DC

## 2021-12-09 MED ORDER — IOHEXOL 300 MG/ML  SOLN
100.0000 mL | Freq: Once | INTRAMUSCULAR | Status: AC | PRN
  Administered 2021-12-09: 100 mL via INTRAVENOUS

## 2021-12-09 MED ORDER — SODIUM CHLORIDE 0.9 % IV BOLUS
1000.0000 mL | Freq: Once | INTRAVENOUS | Status: AC
  Administered 2021-12-09: 1000 mL via INTRAVENOUS

## 2021-12-09 MED ORDER — ALUM & MAG HYDROXIDE-SIMETH 200-200-20 MG/5ML PO SUSP
30.0000 mL | Freq: Once | ORAL | Status: AC
  Administered 2021-12-09: 30 mL via ORAL
  Filled 2021-12-09: qty 30

## 2021-12-09 NOTE — Discharge Instructions (Addendum)
The etiology of your abdominal pain is unclear.    There are many causes of abdominal pain. Most pain is not serious and goes away, but some pain gets worse, changes, or will not go away. Please return to the emergency department or see your doctor right away if you (or your family member) experience any of the following:  1. Pain that gets worse or moves to just one spot.  2. Pain that gets worse if you cough or sneeze.  3. Pain with going over a bump in the road.  4. Pain that does not get better in 24 hours.  5. Inability to keep down liquids (vomiting)-especially if you are making less urine.  6. Fainting.  7. Blood in the vomit or stool.  8. High fever or shaking chills.  9. Swelling of the abdomen.  10. Any new or worsening problem.      Follow-up Instructions  See your primary care provider if not completely better in the next 2-3 days. Follow up with gastroenterology  Additional Instructions  No alcohol.  No caffeine, aspirin, or cigarettes.   Please return to the emergency department immediately for any new or concerning symptoms, or if you get worse.

## 2021-12-09 NOTE — ED Provider Notes (Signed)
[facility] EMERGENCY DEPARTMENT Provider Note   CSN: [id number] Arrival date & time: 12/08/21  2122     History  Chief Complaint  Patient presents with   Chest Pain    Chad Abed Schar. is a 26 y.o. male.  Patient as above with significant medical history as below, including gastritis, IBS who presents to the ED with complaint of abdominal pain, nausea and vomiting.  Patient reports symptoms ongoing around 2 weeks, he was seen recently diagnosed with gastritis.  Completed PPI but symptoms have not improved.  Intermittent nausea past few days, no fevers or chills.  He is on p.o. intake but limited.  No change in bowel or bladder function.  No BRBPR or melena no recent alcohol use, no THC use, no tobacco use.  No abdominal trauma.  He has not yet seen GI yet and is waiting for referral from his PCP.     Past Medical History:  Diagnosis Date   Gastritis    IBS (irritable bowel syndrome)     Past Surgical History:  Procedure Laterality Date   KNEE ARTHROSCOPY WITH ANTERIOR CRUCIATE LIGAMENT (ACL) REPAIR Left    LAPAROSCOPIC APPENDECTOMY N/A 03/01/2018   Procedure: APPENDECTOMY LAPAROSCOPIC;  Surgeon: Abigail Miyamoto, MD;  Location: [facility] OR;  Service: General;  Laterality: N/A;     The history is provided by the patient. No language interpreter was used.  Chest Pain Associated symptoms: abdominal pain, nausea and vomiting   Associated symptoms: no cough, no dysphagia, no fever, no headache, no palpitations and no shortness of breath        Home Medications Prior to Admission medications   Medication Sig Start Date End Date Taking? Authorizing Provider  pantoprazole (PROTONIX) 40 MG tablet Take 1 tablet (40 mg total) by mouth daily for 14 days. 12/09/21 12/23/21 Yes Tanda Rockers A, DO  sucralfate (CARAFATE) 1 g tablet Take 1 tablet (1 g total) by mouth 4 (four) times daily -  with meals and at bedtime for 7 days. 12/09/21 12/16/21 Yes Sloan Leiter, DO   ondansetron (ZOFRAN-ODT) 4 MG disintegrating tablet 4mg  ODT q4 hours prn nausea/vomit 11/22/21   Mesner, 11/24/21, MD      Allergies    Amoxicillin    Review of Systems   Review of Systems  Constitutional:  Negative for chills and fever.  HENT:  Negative for facial swelling and trouble swallowing.   Eyes:  Negative for photophobia and visual disturbance.  Respiratory:  Negative for cough and shortness of breath.   Cardiovascular:  Negative for chest pain and palpitations.  Gastrointestinal:  Positive for abdominal pain, nausea and vomiting.  Endocrine: Negative for polydipsia and polyuria.  Genitourinary:  Negative for difficulty urinating and hematuria.  Musculoskeletal:  Negative for gait problem and joint swelling.  Skin:  Negative for pallor and rash.  Neurological:  Negative for syncope and headaches.  Psychiatric/Behavioral:  Negative for agitation and confusion.     Physical Exam Updated Vital Signs BP 109/74   Pulse (!) 44   Temp 98.4 F (36.9 C) (Oral)   Resp 17   SpO2 100%  Physical Exam Vitals and nursing note reviewed.  Constitutional:      General: He is not in acute distress.    Appearance: Normal appearance. He is well-developed.  HENT:     Head: Normocephalic and atraumatic.     Right Ear: External ear normal.     Left Ear: External ear normal.     Mouth/Throat:  Mouth: Mucous membranes are moist.  Eyes:     General: No scleral icterus. Cardiovascular:     Rate and Rhythm: Normal rate and regular rhythm.     Pulses: Normal pulses.     Heart sounds: Normal heart sounds.  Pulmonary:     Effort: Pulmonary effort is normal. No respiratory distress.     Breath sounds: Normal breath sounds.  Abdominal:     General: Abdomen is flat.     Palpations: Abdomen is soft.     Tenderness: There is abdominal tenderness. There is no guarding or rebound.    Musculoskeletal:        General: Normal range of motion.     Cervical back: Normal range of motion.      Right lower leg: No edema.     Left lower leg: No edema.  Skin:    General: Skin is warm and dry.     Capillary Refill: Capillary refill takes less than 2 seconds.  Neurological:     Mental Status: He is alert and oriented to person, place, and time.  Psychiatric:        Mood and Affect: Mood normal.        Behavior: Behavior normal.     ED Results / Procedures / Treatments   Labs (all labs ordered are listed, but only abnormal results are displayed) Labs Reviewed  BASIC METABOLIC PANEL - Abnormal; Notable for the following components:      Result Value   Potassium 3.2 (*)    BUN 5 (*)    All other components within normal limits  HEPATIC FUNCTION PANEL - Abnormal; Notable for the following components:   Total Bilirubin 1.5 (*)    Indirect Bilirubin 1.4 (*)    All other components within normal limits  CBC  LIPASE, BLOOD  URINALYSIS, ROUTINE W REFLEX MICROSCOPIC  RAPID URINE DRUG SCREEN, HOSP PERFORMED  TROPONIN I (HIGH SENSITIVITY)  TROPONIN I (HIGH SENSITIVITY)    EKG None  Radiology CT ABDOMEN PELVIS W CONTRAST  Result Date: 12/09/2021 CLINICAL DATA:  Acute abdominal pain.  Vomiting. EXAM: CT ABDOMEN AND PELVIS WITH CONTRAST TECHNIQUE: Multidetector CT imaging of the abdomen and pelvis was performed using the standard protocol following bolus administration of intravenous contrast. RADIATION DOSE REDUCTION: This exam was performed according to the departmental dose-optimization program which includes automated exposure control, adjustment of the mA and/or kV according to patient size and/or use of iterative reconstruction technique. CONTRAST:  OMNIPAQUE IOHEXOL 300 MG/ML  SOLN COMPARISON:  March 01, 2018 FINDINGS: Lower chest: No acute abnormality. Hepatobiliary: Low-attenuation adjacent the falciform ligament is likely focal fatty deposition, unchanged. Hepatic steatosis. No suspicious liver mass identified. Portal vein is patent. The gallbladder is normal.  Pancreas: Unremarkable. No pancreatic ductal dilatation or surrounding inflammatory changes. Spleen: Normal in size without focal abnormality. Adrenals/Urinary Tract: Adrenal glands are unremarkable. Kidneys are normal, without renal calculi, focal lesion, or hydronephrosis. Bladder is unremarkable. Stomach/Bowel: Stomach is within normal limits. The patient is status post appendectomy. No evidence of bowel wall thickening, distention, or inflammatory changes. Vascular/Lymphatic: No significant vascular findings are present. No enlarged abdominal or pelvic lymph nodes. Reproductive: Prostate is unremarkable. Other: There is a small fat containing umbilical hernia. No free air or free fluid. Musculoskeletal: No acute or significant osseous findings. IMPRESSION: 1. No cause for the patient's symptoms identified. 2. Hepatic steatosis. 3. Status post appendectomy. 4. Small fat containing umbilical hernia. Electronically Signed   By: Gerome Sam III M.D.  On: 12/09/2021 14:12   DG Chest 2 View  Result Date: 12/08/2021 CLINICAL DATA:  Chest pain. EXAM: CHEST - 2 VIEW COMPARISON:  Chest radiograph dated 11/21/2021. FINDINGS: The heart size and mediastinal contours are within normal limits. Both lungs are clear. The visualized skeletal structures are unremarkable. IMPRESSION: No active cardiopulmonary disease. Electronically Signed   By: Elgie Collard M.D.   On: 12/08/2021 22:16    Procedures Procedures    Medications Ordered in ED Medications  sodium chloride 0.9 % bolus 1,000 mL (1,000 mLs Intravenous New Bag/Given 12/09/21 1315)  alum & mag hydroxide-simeth (MAALOX/MYLANTA) 200-200-20 MG/5ML suspension 30 mL (30 mLs Oral Given 12/09/21 1320)    And  lidocaine (XYLOCAINE) 2 % viscous mouth solution 15 mL (15 mLs Oral Given 12/09/21 1320)  famotidine (PEPCID) IVPB 20 mg premix (0 mg Intravenous Stopped 12/09/21 1511)  ketorolac (TORADOL) 15 MG/ML injection 15 mg (15 mg Intravenous Given 12/09/21 1318)   iohexol (OMNIPAQUE) 300 MG/ML solution 100 mL (100 mLs Intravenous Contrast Given 12/09/21 1345)    ED Course/ Medical Decision Making/ A&P                           Medical Decision Making Amount and/or Complexity of Data Reviewed Labs: ordered. Radiology: ordered.  Risk OTC drugs. Prescription drug management.    CC: abd pain, n/v  This patient presents to the Emergency Department for the above complaint. This involves an extensive number of treatment options and is a complaint that carries with it a high risk of complications and morbidity. Vital signs were reviewed. Serious etiologies considered.  Differential diagnosis includes but is not exclusive to acute cholecystitis, intrathoracic causes for epigastric abdominal pain, gastritis, duodenitis, pancreatitis, small bowel or large bowel obstruction, abdominal aortic aneurysm, hernia, gastritis, etc.  Differential diagnosis includes but is not exclusive to acute appendicitis, renal colic, testicular torsion, urinary tract infection, prostatitis,  diverticulitis, small bowel obstruction, colitis, abdominal aortic aneurysm, gastroenteritis, constipation etc.   Record review:  Previous records obtained and reviewed prior ED visits, prior office visits, prior labs and imaging, medications  Additional history obtained from N/A  Medical and surgical history as noted above.   Work up as above, notable for:  Labs & imaging results that were available during my care of the patient were visualized by me and considered in my medical decision making.  Physical exam as above.   Repeat visit for same symptoms, symptoms worsening, now TTP to abdomen will obtain CT  I ordered imaging studies which included CT abdomen pelvis, chest x-ray. I visualized the imaging, interpreted images, and I agree with radiologist interpretation.  No acute intra-abdominal process noted on imaging of the abdomen pelvis.  Chest x-ray negative.  Cardiac  monitoring reviewed and interpreted personally which shows NSR  Labs reviewed, potassium is mildly depleted, bilirubin is 1.5.  Mildly worsened from prior  Management: GI cocktail, IV fluids  ED Course:     Reassessment:  Patient feeling better, repeat abdominal exam is soft, nontender, nonperitoneal.  He is time p.o. intake with difficulty.  No nausea or vomiting.  Admission was considered.   Chest discomfort appears atypical in nature, low risk heart score, troponin negative.  ECG without acute ischemic changes.  Low risk heart score.  Chest discomfort likely associated with his gastritis, abdominal discomfort.   Favor gastritis as etiology of complaints today.  We will repeat course of Protonix, add Carafate.  Encouraged bland diet. Follow-up with  gastroenterology.  Referral placed  The patient's overall condition has improved, the patient presents with abdominal pain without signs of peritonitis, or other life-threatening serious etiology. The patient understands that at this time there is no evidence for a more malignant underlying process, but the patient also understands that early in the process of an illness, an emergency department workup can be falsely reassuring. Detailed discussions were had with the patient regarding current findings, and need for close f/u with PCP or on call doctor. The patient appears stable for discharge and has been instructed to return immediately if the symptoms worsen in any way for re-evaluation. Patient verbalized understanding and is in agreement with current care plan.  All questions answered prior to discharge.     Social determinants of health include -  Social History   Socioeconomic History   Marital status: Single    Spouse name: Not on file   Number of children: Not on file   Years of education: Not on file   Highest education level: Not on file  Occupational History   Not on file  Tobacco Use   Smoking status: Never   Smokeless  tobacco: Never  Vaping Use   Vaping Use: Never used  Substance and Sexual Activity   Alcohol use: Yes    Comment: occasional   Drug use: Never   Sexual activity: Yes    Birth control/protection: Condom  Other Topics Concern   Not on file  Social History Narrative   Not on file   Social Determinants of Health   Financial Resource Strain: Not on file  Food Insecurity: Not on file  Transportation Needs: Not on file  Physical Activity: Not on file  Stress: Not on file  Social Connections: Not on file  Intimate Partner Violence: Not on file      This chart was dictated using voice recognition software.  Despite best efforts to proofread,  errors can occur which can change the documentation meaning.         Final Clinical Impression(s) / ED Diagnoses Final diagnoses:  Abdominal pain, unspecified abdominal location  Atypical chest pain    Rx / DC Orders ED Discharge Orders          Ordered    sucralfate (CARAFATE) 1 g tablet  3 times daily with meals & bedtime        12/09/21 1516    pantoprazole (PROTONIX) 40 MG tablet  Daily        12/09/21 1516    Ambulatory referral to Gastroenterology        12/09/21 1516              Sloan Leiter, DO 12/09/21 1526

## 2021-12-18 ENCOUNTER — Encounter: Payer: Self-pay | Admitting: Gastroenterology

## 2021-12-23 ENCOUNTER — Encounter: Payer: Self-pay | Admitting: Nurse Practitioner

## 2021-12-28 ENCOUNTER — Ambulatory Visit: Payer: BC Managed Care – PPO | Admitting: Nurse Practitioner

## 2021-12-28 ENCOUNTER — Encounter: Payer: Self-pay | Admitting: Nurse Practitioner

## 2021-12-28 VITALS — BP 102/72 | HR 49 | Temp 97.6°F | Wt 221.4 lb

## 2021-12-28 DIAGNOSIS — R0789 Other chest pain: Secondary | ICD-10-CM

## 2021-12-28 DIAGNOSIS — K580 Irritable bowel syndrome with diarrhea: Secondary | ICD-10-CM | POA: Diagnosis not present

## 2021-12-28 MED ORDER — ALBUTEROL SULFATE HFA 108 (90 BASE) MCG/ACT IN AERS: 2.0000 | INHALATION_SPRAY | Freq: Four times a day (QID) | RESPIRATORY_TRACT | 3 refills | Status: DC | PRN

## 2021-12-28 NOTE — Assessment & Plan Note (Signed)
He is still having ongoing abdominal pain and diarrhea.  He states that he has been trying to increase his fiber with foods.  He has also been trying to track what causes his symptoms to worsen.  He has been slightly frustrated because sometimes he can eat food with no problem and then other times it will worsen his abdominal pain and diarrhea.  Most recently he ate a Caesar salad which worsened his symptoms.  He had a CT scan of his abdomen on 12/08/2021 which was normal.  He does have an appointment with GI at the end of this month, encouraged him to keep this appointment.

## 2021-12-28 NOTE — Patient Instructions (Signed)
It was great to see you!  Start albuterol inhaler as needed for chest pain and shortness of breath. You can use this every 6 hours as needed.   Keep your appointment with the stomach doctor.   Let's follow-up in 2 weeks, sooner if you have concerns.  If a referral was placed today, you will be contacted for an appointment. Please note that routine referrals can sometimes take up to 3-4 weeks to process. Please call our office if you haven't heard anything after this time frame.  Take care,  Rodman Pickle, NP

## 2021-12-28 NOTE — Progress Notes (Signed)
Acute Office Visit  Subjective:     Patient ID: Chad Willis., male    DOB: [birth year], 26 y.o.   MRN: [id number]  Chief Complaint  Patient presents with   Abdominal Pain    Pt c/o sharp-like pain in both sides of abd. Pt states nausea/vomiting has subsided some but now has recurrent chest pain w/ SOB and lightheadedness for several days    HPI Patient is in today for abdominal pain after eating a salad. He has been having lower abdominal pain. The pain is slowly improving. Right now he is having left lower quadrant tenderness. He did get nauseous a few days ago, however that is improving as well. He has been having diarrhea. He has been trying to increase foods with fiber and what food triggers his pain.  He has lost 11 pounds since his last visit.  He has been having intermittent mid chest pain and tightness with shortness of breath for the past few months. It has been happening every day. He has noticed some wheezing. He will have to stop and take a deep breath. He has not noticed any specific triggers. He has also been having a warm sensation that moves from his neck up his head. This happens when he is bending down or squatting to pick something up. He does have some sinus congestion.  He denies cough, fever.  ROS See pertinent positives and negatives per HPI.     Objective:    BP 102/72   Pulse (!) 49   Temp 97.6 F (36.4 C) (Temporal)   Wt 221 lb 6.4 oz (100.4 kg)   SpO2 97%   BMI 30.03 kg/m  Wt Readings from Last 3 Encounters:  12/28/21 221 lb 6.4 oz (100.4 kg)  11/26/21 232 lb 6.4 oz (105.4 kg)  04/15/21 260 lb (117.9 kg)    Physical Exam Vitals and nursing note reviewed.  Constitutional:      Appearance: Normal appearance.  HENT:     Head: Normocephalic.  Eyes:     Conjunctiva/sclera: Conjunctivae normal.  Cardiovascular:     Rate and Rhythm: Normal rate and regular rhythm.     Pulses: Normal pulses.     Heart sounds: Normal heart sounds.   Pulmonary:     Effort: Pulmonary effort is normal.     Breath sounds: Normal breath sounds.  Abdominal:     Palpations: Abdomen is soft.     Tenderness: There is abdominal tenderness in the left lower quadrant. There is no guarding or rebound. Negative signs include Murphy's sign and McBurney's sign.  Musculoskeletal:     Cervical back: Normal range of motion.  Skin:    General: Skin is warm.  Neurological:     General: No focal deficit present.     Mental Status: He is alert and oriented to person, place, and time.  Psychiatric:        Mood and Affect: Mood normal.        Behavior: Behavior normal.        Thought Content: Thought content normal.        Judgment: Judgment normal.      Assessment & Plan:   Problem List Items Addressed This Visit       Digestive   Irritable bowel syndrome with diarrhea - Primary    He is still having ongoing abdominal pain and diarrhea.  He states that he has been trying to increase his fiber with foods.  He has also been  trying to track what causes his symptoms to worsen.  He has been slightly frustrated because sometimes he can eat food with no problem and then other times it will worsen his abdominal pain and diarrhea.  Most recently he ate a Caesar salad which worsened his symptoms.  He had a CT scan of his abdomen on 12/08/2021 which was normal.  He does have an appointment with GI at the end of this month, encouraged him to keep this appointment.        Other   Chest tightness    He has been experiencing intermittent chest tightness and shortness of breath for the past few months.  He has been to the ER and has negative EKGs, troponin, chest x-ray, and CT angio of his chest was negative for PE.  He does notice that he has some wheezing when the symptoms happen.  Could be possible adult onset asthma.  Encouraged him to continue his Zyrtec daily and we will have him start an albuterol inhaler as needed for shortness of breath and chest tightness.   Follow-up in 1 to 2 weeks.       Meds ordered this encounter  Medications   albuterol (VENTOLIN HFA) 108 (90 Base) MCG/ACT inhaler    Sig: Inhale 2 puffs into the lungs every 6 (six) hours as needed for wheezing or shortness of breath.    Dispense:  8 g    Refill:  3    Return in about 2 weeks (around 01/11/2022) for shortness of breath, chest pain.  Gerre Scull, NP

## 2021-12-28 NOTE — Assessment & Plan Note (Signed)
He has been experiencing intermittent chest tightness and shortness of breath for the past few months.  He has been to the ER and has negative EKGs, troponin, chest x-ray, and CT angio of his chest was negative for PE.  He does notice that he has some wheezing when the symptoms happen.  Could be possible adult onset asthma.  Encouraged him to continue his Zyrtec daily and we will have him start an albuterol inhaler as needed for shortness of breath and chest tightness.  Follow-up in 1 to 2 weeks.

## 2022-01-01 ENCOUNTER — Other Ambulatory Visit: Payer: Self-pay | Admitting: Nurse Practitioner

## 2022-01-01 MED ORDER — SUCRALFATE 1 G PO TABS: 1.0000 g | ORAL_TABLET | Freq: Three times a day (TID) | ORAL | 0 refills | Status: DC

## 2022-01-01 NOTE — Telephone Encounter (Signed)
Caller Name: [place] Call back phone #: [phone]  MEDICATION(S): sucralfate (CARAFATE) 1 g tablet   Days of Med Remaining:   Has the patient contacted their pharmacy (YES/NO)? n What did pharmacy advise?  Preferred Pharmacy:  [facility] [id number] - , [place] - [address] Phone:  [phone]      ~~~Please advise patient/caregiver to allow 2-3 business days to process RX refills.

## 2022-01-14 ENCOUNTER — Ambulatory Visit: Payer: BC Managed Care – PPO | Admitting: Nurse Practitioner

## 2022-01-14 NOTE — Progress Notes (Deleted)
   Established Patient Office Visit  Subjective   Patient ID: Chad Willis., male    DOB: 1996/05/20  Age: 26 y.o. MRN: [id number]  No chief complaint on file.   HPI  Chad Willis. Is here to follow-up on chest tightness and shortness of breath.   {History (Optional):23778}  ROS    Objective:     There were no vitals taken for this visit. {Vitals History (Optional):23777}  Physical Exam   No results found for any visits on 01/16/22.  {Labs (Optional):23779}  The ASCVD Risk score (Arnett DK, et al., 2019) failed to calculate for the following reasons:   The 2019 ASCVD risk score is only valid for ages 43 to 16    Assessment & Plan:   Problem List Items Addressed This Visit   None   No follow-ups on file.    Gerre Scull, NP

## 2022-01-15 ENCOUNTER — Encounter: Payer: Self-pay | Admitting: Gastroenterology

## 2022-01-15 ENCOUNTER — Ambulatory Visit: Payer: BC Managed Care – PPO | Admitting: Gastroenterology

## 2022-01-15 ENCOUNTER — Other Ambulatory Visit (INDEPENDENT_AMBULATORY_CARE_PROVIDER_SITE_OTHER): Payer: BC Managed Care – PPO

## 2022-01-15 VITALS — BP 132/74 | HR 79 | Ht 72.0 in | Wt 219.0 lb

## 2022-01-15 DIAGNOSIS — R1084 Generalized abdominal pain: Secondary | ICD-10-CM | POA: Diagnosis not present

## 2022-01-15 DIAGNOSIS — K920 Hematemesis: Secondary | ICD-10-CM

## 2022-01-15 DIAGNOSIS — R5383 Other fatigue: Secondary | ICD-10-CM | POA: Diagnosis not present

## 2022-01-15 DIAGNOSIS — R110 Nausea: Secondary | ICD-10-CM

## 2022-01-15 LAB — SEDIMENTATION RATE: Sed Rate: 11 mm/hr (ref 0–15)

## 2022-01-15 LAB — TSH: TSH: 0.95 u[IU]/mL (ref 0.35–5.50)

## 2022-01-15 LAB — HIGH SENSITIVITY CRP: CRP, High Sensitivity: 0.84 mg/L (ref 0.000–5.000)

## 2022-01-15 MED ORDER — HYOSCYAMINE SULFATE 0.125 MG SL SUBL: 0.1250 mg | SUBLINGUAL_TABLET | SUBLINGUAL | 0 refills | Status: DC | PRN

## 2022-01-15 NOTE — Progress Notes (Signed)
01/15/2022 Chad Willis [id number] [birth year]   HISTORY OF PRESENT ILLNESS: This is a pleasant 26 year old male who is new to our office.  He was referred here by his PCP, Rodman Pickle, NP, for evaluation of irritable bowel syndrome.  He tells me that he has been having a lot of stomach symptoms for the past several months.  His mom passed away due to metastatic breast cancer in March so he has had a lot of grief and stress with that.  He says that he has had left lower quadrant abdominal pain, right lower quadrant abdominal pain, and left upper quadrant abdominal pain as well as some nausea.  He feels excessively tired.  He has occasional diarrhea, but not regularly.  He has had episodes of vomiting and has seen some bright red blood, which is what prompted ER visit.  He also had complained of chest tightness and shortness of breath, but started an inhaler for asthma and really feels like that has helped.  He had chest x-rays, CT angio of the chest, troponins, EKG which were all unremarkable.  CBC, CMP, lipase were all unremarkable.  He was given pantoprazole 40 mg which he took for 2 weeks and then had also been given Carafate 4 times daily.  He still on the Carafate, but ran out of the pantoprazole.  CT abdomen and pelvis with contrast  11/2021:  IMPRESSION: 1. No cause for the patient's symptoms identified. 2. Hepatic steatosis. 3. Status post appendectomy. 4. Small fat containing umbilical hernia.    Past Medical History:  Diagnosis Date   Gastritis    IBS (irritable bowel syndrome)    Past Surgical History:  Procedure Laterality Date   KNEE ARTHROSCOPY WITH ANTERIOR CRUCIATE LIGAMENT (ACL) REPAIR Left    LAPAROSCOPIC APPENDECTOMY N/A 03/01/2018   Procedure: APPENDECTOMY LAPAROSCOPIC;  Surgeon: Abigail Miyamoto, MD;  Location: [facility] OR;  Service: General;  Laterality: N/A;    reports that he has never smoked. He has never used smokeless tobacco. He reports that he  does not currently use alcohol. He reports that he does not use drugs. family history includes Cancer in his maternal aunt, mother, and paternal aunt; Diabetes in his maternal grandfather, maternal grandmother, mother, paternal grandfather, and paternal grandmother; Healthy in his father; Hypertension in his maternal grandfather, maternal grandmother, mother, paternal grandfather, and paternal grandmother. Allergies  Allergen Reactions   Amoxicillin Hives      Outpatient Encounter Medications as of 01/15/2022  Medication Sig   albuterol (VENTOLIN HFA) 108 (90 Base) MCG/ACT inhaler Inhale 2 puffs into the lungs every 6 (six) hours as needed for wheezing or shortness of breath.   sucralfate (CARAFATE) 1 g tablet Take 1 tablet (1 g total) by mouth 4 (four) times daily -  with meals and at bedtime.   cetirizine (ZYRTEC) 10 MG tablet Take 10 mg by mouth daily. (Patient not taking: Reported on 01/15/2022)   ondansetron (ZOFRAN-ODT) 4 MG disintegrating tablet 4mg  ODT q4 hours prn nausea/vomit (Patient not taking: Reported on 01/15/2022)   pantoprazole (PROTONIX) 40 MG tablet Take 1 tablet (40 mg total) by mouth daily for 14 days.   No facility-administered encounter medications on file as of 01/15/2022.     REVIEW OF SYSTEMS  : All other systems reviewed and negative except where noted in the History of Present Illness.   PHYSICAL EXAM: BP 132/74   Pulse 79   Ht 6' (1.829 m)   Wt 219 lb (99.3 kg)  BMI 29.70 kg/m  General: Well developed AA male in no acute distress Head: Normocephalic and atraumatic Eyes:  Sclerae anicteric, conjunctiva pink. Ears: Normal auditory acuity Lungs: Clear throughout to auscultation; no W/R/R. Heart: Regular rate and rhythm; no M/R/G. Abdomen: Soft, non-distended.  BS present.  Non-tender. Musculoskeletal: Symmetrical with no gross deformities  Skin: No lesions on visible extremities Extremities: No edema  Neurological: Alert oriented x 4, grossly  non-focal Psychological:  Alert and cooperative. Normal mood and affect  ASSESSMENT AND PLAN: *26 year old male with complaints of generalized abdominal pain, nausea, fatigue.  Had a few episodes of hematemesis.  Had been on pantoprazole for couple of weeks, but ran out of that.  He was also given Carafate and he still has that which he has been taking 4 times a day.  We will plan for EGD.  Is being scheduled with Dr. Myrtie Neither.  CT scan was unremarkable for cause of his symptoms.  We will check TSH, sed rate, CRP, and celiac labs.  ED told him he likely has gastritis and IBS.  I think that IBS is a possibility.  He has been under a lot of stress since his mother passed away from metastatic breast cancer in March.  The risks, benefits, and alternatives to EGD were discussed with the patient and he consents to proceed.  We also discussed a lactose-free diet and his given literature on this to see if this would help with some of his symptoms.   CC:  Gerre Scull, NP

## 2022-01-15 NOTE — Patient Instructions (Signed)
_______________________________________________________  If you are age 25 or older, your body mass index should be between 23-30. Your Body mass index is 29.7 kg/m. If this is out of the aforementioned range listed, please consider follow up with your Primary Care Provider.  If you are age 74 or younger, your body mass index should be between 19-25. Your Body mass index is 29.7 kg/m. If this is out of the aformentioned range listed, please consider follow up with your Primary Care Provider.   ________________________________________________________  The [facility] providers would like to encourage you to use [facility] to communicate with providers for non-urgent requests or questions.  Due to long hold times on the telephone, sending your provider a message by [facility] may be a faster and more efficient way to get a response.  Please allow 48 business hours for a response.  Please remember that this is for non-urgent requests.  _______________________________________________________  Your provider has requested that you go to the basement level for lab work before leaving today. Press "B" on the elevator. The lab is located at the first door on the left as you exit the elevator.  We have sent the following medications to your pharmacy for you to pick up at your convenience:  Levsin  You have been scheduled for an endoscopy. Please follow written instructions given to you at your visit today. If you use inhalers (even only as needed), please bring them with you on the day of your procedure.   Lactose free diet

## 2022-01-16 ENCOUNTER — Ambulatory Visit: Payer: BC Managed Care – PPO | Admitting: Nurse Practitioner

## 2022-01-16 LAB — IGA: Immunoglobulin A: 149 mg/dL (ref 47–310)

## 2022-01-17 NOTE — Progress Notes (Signed)
____________________________________________________________  Attending physician addendum:  Thank you for sending this case to me. I have reviewed the entire note and agree with the plan.   Jennifer Holland Danis, MD  ____________________________________________________________  

## 2022-01-18 ENCOUNTER — Encounter: Payer: Self-pay | Admitting: Gastroenterology

## 2022-01-24 ENCOUNTER — Encounter: Payer: Self-pay | Admitting: Gastroenterology

## 2022-01-24 ENCOUNTER — Ambulatory Visit: Payer: BC Managed Care – PPO | Admitting: Gastroenterology

## 2022-01-24 VITALS — BP 111/63 | HR 78 | Temp 97.5°F | Resp 16 | Ht 72.0 in | Wt 219.0 lb

## 2022-01-24 DIAGNOSIS — R110 Nausea: Secondary | ICD-10-CM

## 2022-01-24 DIAGNOSIS — K2951 Unspecified chronic gastritis with bleeding: Secondary | ICD-10-CM | POA: Diagnosis not present

## 2022-01-24 DIAGNOSIS — K920 Hematemesis: Secondary | ICD-10-CM

## 2022-01-24 DIAGNOSIS — R1084 Generalized abdominal pain: Secondary | ICD-10-CM

## 2022-01-24 DIAGNOSIS — B9681 Helicobacter pylori [H. pylori] as the cause of diseases classified elsewhere: Secondary | ICD-10-CM | POA: Diagnosis not present

## 2022-01-24 MED ORDER — SODIUM CHLORIDE 0.9 % IV SOLN: 500.0000 mL | INTRAVENOUS | Status: DC

## 2022-01-24 NOTE — Patient Instructions (Signed)
YOU HAD AN ENDOSCOPIC PROCEDURE TODAY: Refer to the procedure report and other information in the discharge instructions given to you for any specific questions about what was found during the examination. If this information does not answer your questions, please call [place] office at [phone] to clarify.   YOU SHOULD EXPECT: Some feelings of bloating in the abdomen. Passage of more gas than usual. Walking can help get rid of the air that was put into your GI tract during the procedure and reduce the bloating. If you had a lower endoscopy (such as a colonoscopy or flexible sigmoidoscopy) you may notice spotting of blood in your stool or on the toilet paper. Some abdominal soreness may be present for a day or two, also.  DIET: Your first meal following the procedure should be a light meal and then it is ok to progress to your normal diet. A half-sandwich or bowl of soup is an example of a good first meal. Heavy or fried foods are harder to digest and may make you feel nauseous or bloated. Drink plenty of fluids but you should avoid alcoholic beverages for 24 hours. If you had a esophageal dilation, please see attached instructions for diet.    ACTIVITY: Your care partner should take you home directly after the procedure. You should plan to take it easy, moving slowly for the rest of the day. You can resume normal activity the day after the procedure however YOU SHOULD NOT DRIVE, use power tools, machinery or perform tasks that involve climbing or major physical exertion for 24 hours (because of the sedation medicines used during the test).   SYMPTOMS TO REPORT IMMEDIATELY: A gastroenterologist can be reached at any hour. Please call [phone]  for any of the following symptoms:   Following upper endoscopy (EGD, EUS, ERCP, esophageal dilation) Vomiting of blood or coffee ground material  New, significant abdominal pain  New, significant chest pain or pain under the shoulder blades  Painful or  persistently difficult swallowing  New shortness of breath  Black, tarry-looking or red, bloody stools  FOLLOW UP:  If any biopsies were taken you will be contacted by phone or by letter within the next 1-3 weeks. Call [phone]  if you have not heard about the biopsies in 3 weeks.  Please also call with any specific questions about appointments or follow up tests.  

## 2022-01-24 NOTE — Progress Notes (Signed)
Called to room to assist during endoscopic procedure.  Patient ID and intended procedure confirmed with present staff. Received instructions for my participation in the procedure from the performing physician.  

## 2022-01-24 NOTE — Progress Notes (Signed)
Pt in recovery with monitors in place, VSS. Report given to receiving RN. Bite guard was placed with pt awake to ensure comfort. No dental or soft tissue damage noted. 

## 2022-01-24 NOTE — Progress Notes (Signed)
Pt's states no medical or surgical changes since previsit or office visit. 

## 2022-01-24 NOTE — Progress Notes (Signed)
No changes to clinical history since GI office visit on 01/15/22.  The patient is appropriate for an endoscopic procedure in the ambulatory setting.  - Zorion Nims Danis, MD    

## 2022-01-24 NOTE — Op Note (Signed)
[facility] Patient Name: Chad Willis Procedure Date: 01/24/2022 9:44 AM MRN: [id number] Endoscopist: Sherilyn Cooter L. Myrtie Neither , MD Age: 26 Referring MD:  Date of Birth: 01/12/1996 Gender: Male Account #: [id number] Procedure:                Upper GI endoscopy Indications:              Generalized abdominal pain, Hematemesis (during an                            episode of vomiting), Nausea                           (see recent office consult note for details - labs                            and imaging with recent ED visit. No H pylori or                            celiac testing Medicines:                Monitored Anesthesia Care Procedure:                Pre-Anesthesia Assessment:                           - Prior to the procedure, a History and Physical                            was performed, and patient medications and                            allergies were reviewed. The patient's tolerance of                            previous anesthesia was also reviewed. The risks                            and benefits of the procedure and the sedation                            options and risks were discussed with the patient.                            All questions were answered, and informed consent                            was obtained. Prior Anticoagulants: The patient has                            taken no previous anticoagulant or antiplatelet                            agents. ASA Grade Assessment: II - A patient with  mild systemic disease. After reviewing the risks                            and benefits, the patient was deemed in                            satisfactory condition to undergo the procedure.                           After obtaining informed consent, the endoscope was                            passed under direct vision. Throughout the                            procedure, the patient's blood pressure, pulse, and                             oxygen saturations were monitored continuously. The                            GIF [id number] #[id number] was introduced through the                            mouth, and advanced to the second part of duodenum.                            The upper GI endoscopy was accomplished without                            difficulty. The patient tolerated the procedure                            well. Scope In: Scope Out: Findings:                 The esophagus was normal.                           The entire examined stomach was normal. Biopsies                            were taken with a cold forceps for histology.                            (antrum and body)                           The cardia and gastric fundus were normal on                            retroflexion.                           The examined duodenum was normal. Biopsies for  histology were taken with a cold forceps for                            evaluation of celiac disease. Complications:            No immediate complications. Estimated Blood Loss:     Estimated blood loss was minimal. Impression:               - Normal esophagus.                           - Normal stomach. Biopsied.                           - Normal examined duodenum. Biopsied. Recommendation:           - Patient has a contact number available for                            emergencies. The signs and symptoms of potential                            delayed complications were discussed with the                            patient. Return to normal activities tomorrow.                            Written discharge instructions were provided to the                            patient.                           - Resume previous diet.                           - Continue present medications if they have helped                            relieve symptoms. If not, discontinue them.                           - Await pathology results.                            - Return to my office at appointment to be                            scheduled. Cherry Wittwer L. Myrtie Neither, MD 01/24/2022 10:05:10 AM This report has been signed electronically.

## 2022-01-24 NOTE — Progress Notes (Signed)
Established Patient Office Visit  Subjective   Patient ID: Chad Willis., male    DOB: [birth year]  Age: 26 y.o. MRN: [id number]  Chief Complaint  Patient presents with   Follow-up    2 wk f/u SOB, chest pain and stomach pain    HPI  Chad Willis. Is here to follow-up on chest tightness and shortness of breath.   He states that the albuterol inhaler has been helping with his shortness of breath and chest tightness.  He has been using it about twice a day.  He has not started the Zyrtec yet.  Overall he feels like his breathing is getting better.  He went to GI yesterday and had an endoscopy.  They took biopsies, however he was told that everything looks okay.  He has been experiencing an increase in stress recently, his mom passed away in 08-21-2022 and he is finishing up his bachelor's and go back to school for his masters.  He states like he is feeling little bit overwhelmed at times.    ROS See pertinent positives and negatives per HPI.    Objective:     BP 102/78 (BP Location: Left Arm, Patient Position: Sitting, Cuff Size: Normal)   Pulse (!) 59   Temp 98.4 F (36.9 C) (Temporal)   Wt 221 lb 9.6 oz (100.5 kg)   SpO2 98%   BMI 30.05 kg/m    Physical Exam Vitals and nursing note reviewed.  Constitutional:      Appearance: Normal appearance.  HENT:     Head: Normocephalic.  Eyes:     Conjunctiva/sclera: Conjunctivae normal.  Cardiovascular:     Rate and Rhythm: Normal rate and regular rhythm.     Pulses: Normal pulses.     Heart sounds: Normal heart sounds.  Pulmonary:     Effort: Pulmonary effort is normal.     Breath sounds: Normal breath sounds.  Musculoskeletal:     Cervical back: Normal range of motion.  Skin:    General: Skin is warm.  Neurological:     General: No focal deficit present.     Mental Status: He is alert and oriented to person, place, and time.  Psychiatric:        Mood and Affect: Mood normal.        Behavior:  Behavior normal.        Thought Content: Thought content normal.        Judgment: Judgment normal.      Assessment & Plan:   Problem List Items Addressed This Visit       Respiratory   Mild persistent asthma with acute exacerbation - Primary    He states that the albuterol is really helping his symptoms of shortness of breath and wheezing.  He has been using it at least twice a day.  We will start him on Qvar inhaler as a maintenance inhaler 2 puffs twice a day.  Discussed rinsing his mouth out or brushing his teeth after using.  He can continue to use the albuterol inhaler as needed.  Follow-up at next scheduled appointment in October.      Relevant Medications   beclomethasone (QVAR REDIHALER) 40 MCG/ACT inhaler     Other   Nausea    He is following with GI and had an endoscopy yesterday.  They did some biopsies, however everything looks normal.  They believe that it could be his increased stress which is causing this nausea and upset stomach.  Continue collaboration recommendations from GI.      Grief    He states that his great aunt passed away and then a week later in 08-09-22 his mom passed away.  He is also finishing up his bachelor's in starting school for his masters.  He is having increased stress and having a hard time dealing with his grief.  We will place a referral for counseling.  Discussed exercise, making sure he is getting enough sleep every night, meditating, and journaling to also help with his stress.  Follow-up in neck scheduled visit in October.      Relevant Orders   Ambulatory referral to Psychology    Return in about 3 months (around 04/26/2022), or if symptoms worsen or fail to improve, for keep appointment in October.    Gerre Scull, NP

## 2022-01-25 ENCOUNTER — Encounter: Payer: Self-pay | Admitting: Nurse Practitioner

## 2022-01-25 ENCOUNTER — Ambulatory Visit: Payer: BC Managed Care – PPO | Admitting: Nurse Practitioner

## 2022-01-25 ENCOUNTER — Telehealth: Payer: Self-pay | Admitting: *Deleted

## 2022-01-25 VITALS — BP 102/78 | HR 59 | Temp 98.4°F | Wt 221.6 lb

## 2022-01-25 DIAGNOSIS — J4530 Mild persistent asthma, uncomplicated: Secondary | ICD-10-CM

## 2022-01-25 DIAGNOSIS — J45909 Unspecified asthma, uncomplicated: Secondary | ICD-10-CM | POA: Insufficient documentation

## 2022-01-25 DIAGNOSIS — F4321 Adjustment disorder with depressed mood: Secondary | ICD-10-CM | POA: Diagnosis not present

## 2022-01-25 DIAGNOSIS — R110 Nausea: Secondary | ICD-10-CM | POA: Diagnosis not present

## 2022-01-25 DIAGNOSIS — J4531 Mild persistent asthma with (acute) exacerbation: Secondary | ICD-10-CM | POA: Insufficient documentation

## 2022-01-25 MED ORDER — QVAR REDIHALER 40 MCG/ACT IN AERB: 2.0000 | INHALATION_SPRAY | Freq: Two times a day (BID) | RESPIRATORY_TRACT | 1 refills | Status: DC

## 2022-01-25 NOTE — Assessment & Plan Note (Signed)
He is following with GI and had an endoscopy yesterday.  They did some biopsies, however everything looks normal.  They believe that it could be his increased stress which is causing this nausea and upset stomach.  Continue collaboration recommendations from GI.

## 2022-01-25 NOTE — Assessment & Plan Note (Signed)
He states that the albuterol is really helping his symptoms of shortness of breath and wheezing.  He has been using it at least twice a day.  We will start him on Qvar inhaler as a maintenance inhaler 2 puffs twice a day.  Discussed rinsing his mouth out or brushing his teeth after using.  He can continue to use the albuterol inhaler as needed.  Follow-up at next scheduled appointment in October.

## 2022-01-25 NOTE — Patient Instructions (Addendum)
It was great to see you!  Start Qvar inhaler twice a day every day (2puffs). Rinse your mouth out after using. Then you can keep using the albuterol inhaler as needed for shortness of breath or chest tightness.   You can start increasing exercise and working out.   Make sure you stretch your back/shoulder every day.   I have placed a referral for grief counseling   Let's follow-up at your appointment in October, sooner if you have concerns.  If a referral was placed today, you will be contacted for an appointment. Please note that routine referrals can sometimes take up to 3-4 weeks to process. Please call our office if you haven't heard anything after this time frame.  Take care,  Rodman Pickle, NP

## 2022-01-25 NOTE — Telephone Encounter (Signed)
Post procedure follow up call placed, no answer and left VM.  

## 2022-01-25 NOTE — Assessment & Plan Note (Signed)
He states that his great aunt passed away and then a week later in 04-Aug-2022 his mom passed away.  He is also finishing up his bachelor's in starting school for his masters.  He is having increased stress and having a hard time dealing with his grief.  We will place a referral for counseling.  Discussed exercise, making sure he is getting enough sleep every night, meditating, and journaling to also help with his stress.  Follow-up in neck scheduled visit in October.

## 2022-01-29 ENCOUNTER — Other Ambulatory Visit: Payer: Self-pay

## 2022-01-29 MED ORDER — BISMUTH SUBSALICYLATE 262 MG PO TABS: 524.0000 mg | ORAL_TABLET | Freq: Four times a day (QID) | ORAL | 0 refills | Status: AC

## 2022-01-29 MED ORDER — METRONIDAZOLE 500 MG PO TABS: 500.0000 mg | ORAL_TABLET | Freq: Three times a day (TID) | ORAL | 0 refills | Status: AC

## 2022-01-29 MED ORDER — OMEPRAZOLE 20 MG PO CPDR: 20.0000 mg | DELAYED_RELEASE_CAPSULE | Freq: Two times a day (BID) | ORAL | 0 refills | Status: DC

## 2022-01-29 MED ORDER — DOXYCYCLINE HYCLATE 100 MG PO CAPS: 100.0000 mg | ORAL_CAPSULE | Freq: Two times a day (BID) | ORAL | 0 refills | Status: AC

## 2022-02-15 ENCOUNTER — Encounter: Payer: Self-pay | Admitting: Nurse Practitioner

## 2022-02-15 MED ORDER — NYSTATIN 100000 UNIT/ML MT SUSP: 5.0000 mL | Freq: Four times a day (QID) | OROMUCOSAL | 1 refills | Status: DC

## 2022-02-19 ENCOUNTER — Ambulatory Visit (INDEPENDENT_AMBULATORY_CARE_PROVIDER_SITE_OTHER): Payer: BC Managed Care – PPO | Admitting: Behavioral Health

## 2022-02-19 DIAGNOSIS — F4321 Adjustment disorder with depressed mood: Secondary | ICD-10-CM

## 2022-02-19 DIAGNOSIS — F4322 Adjustment disorder with anxiety: Secondary | ICD-10-CM | POA: Diagnosis not present

## 2022-02-19 NOTE — Progress Notes (Signed)
                Karmon Andis L Cheryll Keisler, LMFT 
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
## 2022-02-25 ENCOUNTER — Ambulatory Visit: Payer: BC Managed Care – PPO | Admitting: Nurse Practitioner

## 2022-02-27 ENCOUNTER — Telehealth: Payer: Self-pay

## 2022-02-27 DIAGNOSIS — A048 Other specified bacterial intestinal infections: Secondary | ICD-10-CM

## 2022-02-27 NOTE — Telephone Encounter (Signed)
Reminder letter, written order for [facility]rea Breath test, and [facility] location mailed to patient. Urea breath test order in epic.

## 2022-02-27 NOTE — Telephone Encounter (Signed)
-----   Message from Yevette Edwards, RN sent at 01/29/2022  3:21 PM EDT ----- Regarding: Urea breath test/appt Urea breath test - need to enter order Patient will be due for breath test on or after 03/05/22 Mail reminder to patient with order and [facility] instructions Patient also needs to be scheduled for a follow up appt with Dr. Loletha Carrow - abdominal pain/N&V

## 2022-03-01 ENCOUNTER — Encounter: Payer: BC Managed Care – PPO | Admitting: Nurse Practitioner

## 2022-03-04 ENCOUNTER — Telehealth: Payer: Self-pay | Admitting: Nurse Practitioner

## 2022-03-04 ENCOUNTER — Encounter: Payer: Self-pay | Admitting: Nurse Practitioner

## 2022-03-04 ENCOUNTER — Encounter: Payer: BC Managed Care – PPO | Admitting: Nurse Practitioner

## 2022-03-04 NOTE — Telephone Encounter (Signed)
Noted  

## 2022-03-04 NOTE — Telephone Encounter (Signed)
Pt was no show for 03/01/2022 & 03/04/2022 appointments. Both were cancelled via notification to the after-hours nurse line and not w/in 24 hours of visit. I am sending final warning letter.   Pt next OV scheduled for 04/30/2022

## 2022-03-05 ENCOUNTER — Ambulatory Visit: Payer: BC Managed Care – PPO | Admitting: Behavioral Health

## 2022-03-05 DIAGNOSIS — F4322 Adjustment disorder with anxiety: Secondary | ICD-10-CM | POA: Diagnosis not present

## 2022-03-05 DIAGNOSIS — F4321 Adjustment disorder with depressed mood: Secondary | ICD-10-CM

## 2022-03-05 NOTE — Progress Notes (Signed)
                Chad Willis L [name], LMFT 

## 2022-03-05 NOTE — Progress Notes (Signed)
[facility] Counselor/Therapist Progress Note  Patient ID: Chad Willis., MRN: [id number],    Date: 03/05/2022  Time Spent: 18 min In Person @ [facility]   Treatment Type: Individual Therapy  Reported Symptoms: dec in anx/dep & grief rxn due to Pt actively considering the discussion in our first session, using the [email or web address] app & taking full advantage of the Balloon Release Ritual w/his Family after Mother's death over a mos ago  Mental Status Exam: Appearance:  Casual     Behavior: Appropriate and Sharing  Motor: Normal  Speech/Language:  Clear and Coherent and Normal Rate  Affect: Appropriate  Mood: normal  Thought process: normal  Thought content:   WNL  Sensory/Perceptual disturbances:   WNL  Orientation: oriented to person, place, and time/date  Attention: Good  Concentration: Good  Memory: WNL  Fund of knowledge:  Good  Insight:   Good  Judgment:  Good  Impulse Control: Good   Risk Assessment: Danger to Self:  No Self-injurious Behavior: No Danger to Others: No Duty to Warn:no Physical Aggression / Violence:No  Access to Firearms a concern: No  Gang Involvement:No   Subjective: Pt reports he is on an improved track & glad he initiated psychotherapy as it has already assisted him to implement tools to assist himself with anx/dep. Pt has realized a lot about himself in the past few wks & sts it is similar to "experiencing enlightenment" since the death of his Mother. Pt enjoyed the Balloon Release Ritual w/his Family & he also visited the graves of his Paternal GM & Snover.   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 He has considered that work is not all there is to life & he needs other components to be fulfilled & successful. Pt is trying to control what he can & trust his instincts to lead him down the right path-this is all he can do.   Interventions: Solution-Oriented/Positive Psychology and Grief  Therapy  Diagnosis:Adjustment disorder with anxious mood  Grief reaction  Plan: [place] has taken time for himself that has given him substantial understanding about who he is as a man. He is in a change moment using new thoughts & ideas to turn his life into a meaningful exp & not just work. He has thought about the things his Parents have shared w/him & he feels the his world is opening up. His Parents have supplied him with a good balance of thinking. His Dad has always taught him the trad'l form of being a man; work & fixing things. His Mother used to say to him, "have some fun & relax". Pt will cont to use his new thinking to care for himself in his new relationship w/a past GF-they speak daily. He will attend a few more sessions for check-in & progress.  Target Date: 03/26/2022  Progress: 7  Frequency: 2 more sessions to track progress & then reassess  Modality: Boykin Reaper, LMFT

## 2022-03-11 ENCOUNTER — Ambulatory Visit (INDEPENDENT_AMBULATORY_CARE_PROVIDER_SITE_OTHER): Payer: BC Managed Care – PPO | Admitting: Nurse Practitioner

## 2022-03-11 ENCOUNTER — Encounter: Payer: Self-pay | Admitting: Nurse Practitioner

## 2022-03-11 VITALS — BP 113/60 | HR 77 | Temp 97.0°F | Wt 228.6 lb

## 2022-03-11 DIAGNOSIS — F4321 Adjustment disorder with depressed mood: Secondary | ICD-10-CM | POA: Diagnosis not present

## 2022-03-11 DIAGNOSIS — K580 Irritable bowel syndrome with diarrhea: Secondary | ICD-10-CM | POA: Diagnosis not present

## 2022-03-11 DIAGNOSIS — J4530 Mild persistent asthma, uncomplicated: Secondary | ICD-10-CM

## 2022-03-11 DIAGNOSIS — Z23 Encounter for immunization: Secondary | ICD-10-CM | POA: Diagnosis not present

## 2022-03-11 DIAGNOSIS — Z0000 Encounter for general adult medical examination without abnormal findings: Secondary | ICD-10-CM

## 2022-03-11 NOTE — Patient Instructions (Signed)
It was great to see you!  Keep using the qvar inhaler.   Let's follow-up in 6 months, sooner if you have concerns.  If a referral was placed today, you will be contacted for an appointment. Please note that routine referrals can sometimes take up to 3-4 weeks to process. Please call our office if you haven't heard anything after this time frame.  Take care,  Vance Peper, NP

## 2022-03-11 NOTE — Assessment & Plan Note (Signed)
Chronic, stable.  He is following with GI and had a recent EGD.  Continue collaboration and recommendations from GI.

## 2022-03-11 NOTE — Progress Notes (Signed)
BP 113/60 (BP Location: Left Arm, Patient Position: Sitting)   Pulse 77   Temp (!) 97 F (36.1 C) (Temporal)   Wt 228 lb 9.6 oz (103.7 kg)   SpO2 100%   BMI 31.00 kg/m    Subjective:    Patient ID: Chad Chad., male    DOB: 05-21-1996, 26 y.o.   MRN: [id number]  CC: Chief Complaint  Patient presents with   Annual Exam    CPE, pt is fasting   HPI: Chad Chad. is a 26 y.o. male presenting on 03/11/2022 for comprehensive medical examination. Current medical complaints include:none   Depression Screen done today and results listed below:     03/11/2022   10:09 AM 11/26/2021    3:20 PM  Depression screen PHQ 2/9  Decreased Interest 1 1  Down, Depressed, Hopeless 0 1  PHQ - 2 Score 1 2  Altered sleeping 1 1  Tired, decreased energy 1 2  Change in appetite 1 2  Feeling bad or failure about yourself  0 1  Trouble concentrating 1 0  Moving slowly or fidgety/restless 1 0  Suicidal thoughts 0 0  PHQ-9 Score 6 8  Difficult doing work/chores Not difficult at all Somewhat difficult    The patient does not have a history of falls. I did not complete a risk assessment for falls. A plan of care for falls was not documented.   Past Medical History:  Past Medical History:  Diagnosis Date   Allergy    Gastritis    IBS (irritable bowel syndrome)     Surgical History:  Past Surgical History:  Procedure Laterality Date   KNEE ARTHROSCOPY WITH ANTERIOR CRUCIATE LIGAMENT (ACL) REPAIR Left    LAPAROSCOPIC APPENDECTOMY N/A 03/01/2018   Procedure: APPENDECTOMY LAPAROSCOPIC;  Surgeon: Coralie Keens, MD;  Location: [facility];  Service: General;  Laterality: N/A;    Medications:  Current Outpatient Medications on File Prior to Visit  Medication Sig   albuterol (VENTOLIN HFA) 108 (90 Base) MCG/ACT inhaler Inhale 2 puffs into the lungs every 6 (six) hours as needed for wheezing or shortness of breath.   beclomethasone (QVAR REDIHALER) 40 MCG/ACT inhaler  Inhale 2 puffs into the lungs 2 (two) times daily.   hyoscyamine (LEVSIN SL) 0.125 MG SL tablet Place 1 tablet (0.125 mg total) under the tongue every 4 (four) hours as needed.   nystatin (MYCOSTATIN) 100000 UNIT/ML suspension Take 5 mLs (500,000 Units total) by mouth 4 (four) times daily.   No current facility-administered medications on file prior to visit.    Allergies:  Allergies  Allergen Reactions   Amoxicillin Hives    Social History:  Social History   Socioeconomic History   Marital status: Single    Spouse name: Not on file   Number of children: 0   Years of education: Not on file   Highest education level: Not on file  Occupational History   Occupation: delivery driver  Tobacco Use   Smoking status: Never   Smokeless tobacco: Never  Vaping Use   Vaping Use: Never used  Substance and Sexual Activity   Alcohol use: Not Currently    Comment: occasional   Drug use: Never   Sexual activity: Yes    Birth control/protection: Condom  Other Topics Concern   Not on file  Social History Narrative   Not on file   Social Determinants of Health   Financial Resource Strain: Not on file  Food Insecurity: Not on file  Transportation Needs: Not on file  Physical Activity: Not on file  Stress: Not on file  Social Connections: Not on file  Intimate Partner Violence: Not on file   Social History   Tobacco Use  Smoking Status Never  Smokeless Tobacco Never   Social History   Substance and Sexual Activity  Alcohol Use Not Currently   Comment: occasional    Family History:  Family History  Problem Relation Age of Onset   Cancer Mother        breast   Diabetes Mother    Hypertension Mother    Healthy Father    Hypertension Maternal Grandmother    Diabetes Maternal Grandmother    Hypertension Maternal Grandfather    Diabetes Maternal Grandfather    Hypertension Paternal Grandmother    Diabetes Paternal Grandmother    Hypertension Paternal Grandfather     Diabetes Paternal Grandfather    Cancer Maternal Aunt        breast   Cancer Paternal Aunt        breast   Stomach cancer Neg Hx    Esophageal cancer Neg Hx    Colon cancer Neg Hx     Past medical history, surgical history, medications, allergies, family history and social history reviewed with patient today and changes made to appropriate areas of the chart.   Review of Systems  Constitutional: Negative.   HENT: Negative.    Respiratory: Negative.    Cardiovascular: Negative.   Gastrointestinal:  Positive for abdominal pain (improving).  Genitourinary: Negative.   Musculoskeletal: Negative.   Skin: Negative.   Neurological: Negative.   Psychiatric/Behavioral: Negative.     All other ROS negative except what is listed above and in the HPI.      Objective:    BP 113/60 (BP Location: Left Arm, Patient Position: Sitting)   Pulse 77   Temp (!) 97 F (36.1 C) (Temporal)   Wt 228 lb 9.6 oz (103.7 kg)   SpO2 100%   BMI 31.00 kg/m   Wt Readings from Last 3 Encounters:  03/11/22 228 lb 9.6 oz (103.7 kg)  01/25/22 221 lb 9.6 oz (100.5 kg)  01/24/22 219 lb (99.3 kg)    Physical Exam Vitals and nursing note reviewed.  Constitutional:      General: He is not in acute distress.    Appearance: Normal appearance.  HENT:     Head: Normocephalic and atraumatic.     Right Ear: Tympanic membrane, ear canal and external ear normal.     Left Ear: Tympanic membrane, ear canal and external ear normal.  Eyes:     Conjunctiva/sclera: Conjunctivae normal.  Cardiovascular:     Rate and Rhythm: Normal rate and regular rhythm.     Pulses: Normal pulses.     Heart sounds: Normal heart sounds.  Pulmonary:     Effort: Pulmonary effort is normal.     Breath sounds: Normal breath sounds.  Abdominal:     Palpations: Abdomen is soft.     Tenderness: There is no abdominal tenderness.  Musculoskeletal:        General: Normal range of motion.     Cervical back: Normal range of motion and  neck supple. No tenderness.     Right lower leg: No edema.     Left lower leg: No edema.  Lymphadenopathy:     Cervical: No cervical adenopathy.  Skin:    General: Skin is warm and dry.  Neurological:     General: No focal  deficit present.     Mental Status: He is alert and oriented to person, place, and time.     Cranial Nerves: No cranial nerve deficit.     Coordination: Coordination normal.     Gait: Gait normal.  Psychiatric:        Mood and Affect: Mood normal.        Behavior: Behavior normal.        Thought Content: Thought content normal.        Judgment: Judgment normal.     Results for orders placed or performed in visit on 01/15/22  IgA  Result Value Ref Range   Immunoglobulin A 149 47 - 310 mg/dL  High sensitivity CRP  Result Value Ref Range   CRP, High Sensitivity 0.840 0.000 - 5.000 mg/L  Sedimentation rate  Result Value Ref Range   Sed Rate 11 0 - 15 mm/hr  TSH  Result Value Ref Range   TSH 0.95 0.35 - 5.50 uIU/mL      Assessment & Plan:   Problem List Items Addressed This Visit       Respiratory   Asthma    Mild, persistent asthma stable and controlled.  Continue Qvar inhaler twice a day and rinse his mouth out after using.  He has been using his albuterol inhaler very rarely.  We will update pneumonia vaccine today.  Follow-up in 6 months        Digestive   Irritable bowel syndrome with diarrhea    Chronic, stable.  He is following with GI and had a recent EGD.  Continue collaboration and recommendations from GI.        Other   Grief    Chronic, improving.  He started seeing a therapist and states that this is helped a lot.  Continue talking with therapist routinely.      Other Visit Diagnoses     Routine general medical examination at a health care facility    -  Primary   Health maintenance reviewed and updated.  Reviewed recent labs.  Discussed nutrition, exercise.  Follow-up 1 year   Need for pneumococcal 20-valent conjugate  vaccination       Prevnar 20 given today   Relevant Orders   Pneumococcal conjugate vaccine 20-valent (Completed)   Need for HPV vaccination       HPV vaccine #2 given today   Relevant Orders   HPV 9-valent vaccine,Recombinat (Completed)       IMMUNIZATIONS:   - Tdap: Tetanus vaccination status reviewed: last tetanus booster within 10 years. - Influenza: Refused - Pneumovax: Not applicable - Prevnar: Administered today - HPV: Administered today - Zostavax vaccine: Not applicable  SCREENING: - Colonoscopy: Not applicable  Discussed with patient purpose of the colonoscopy is to detect colon cancer at curable precancerous or early stages   - AAA Screening: Not applicable  -Hearing Test: Not applicable  -Spirometry: Not applicable   PATIENT COUNSELING:    Sexuality: Discussed sexually transmitted diseases, partner selection, use of condoms, avoidance of unintended pregnancy  and contraceptive alternatives.   Advised to avoid cigarette smoking.  I discussed with the patient that most people either abstain from alcohol or drink within safe limits (<=14/week and <=4 drinks/occasion for males, <=7/weeks and <= 3 drinks/occasion for females) and that the risk for alcohol disorders and other health effects rises proportionally with the number of drinks per week and how often a drinker exceeds daily limits.  Discussed cessation/primary prevention of drug use and availability of  treatment for abuse.   Diet: Encouraged to adjust caloric intake to maintain  or achieve ideal body weight, to reduce intake of dietary saturated fat and total fat, to limit sodium intake by avoiding high sodium foods and not adding table salt, and to maintain adequate dietary potassium and calcium preferably from fresh fruits, vegetables, and low-fat dairy products.    stressed the importance of regular exercise  Injury prevention: Discussed safety belts, safety helmets, smoke detector, smoking near bedding or  upholstery.   Dental health: Discussed importance of regular tooth brushing, flossing, and dental visits.   Follow up plan: NEXT PREVENTATIVE PHYSICAL DUE IN 1 YEAR. Return in about 6 months (around 09/10/2022) for asthma.

## 2022-03-11 NOTE — Assessment & Plan Note (Signed)
Mild, persistent asthma stable and controlled.  Continue Qvar inhaler twice a day and rinse his mouth out after using.  He has been using his albuterol inhaler very rarely.  We will update pneumonia vaccine today.  Follow-up in 6 months

## 2022-03-11 NOTE — Assessment & Plan Note (Signed)
Chronic, improving.  He started seeing a therapist and states that this is helped a lot.  Continue talking with therapist routinely.

## 2022-03-18 ENCOUNTER — Other Ambulatory Visit: Payer: Self-pay

## 2022-03-18 ENCOUNTER — Emergency Department (HOSPITAL_COMMUNITY): Payer: BC Managed Care – PPO

## 2022-03-18 ENCOUNTER — Encounter (HOSPITAL_COMMUNITY): Payer: Self-pay

## 2022-03-18 ENCOUNTER — Emergency Department (HOSPITAL_COMMUNITY)
Admission: EM | Admit: 2022-03-18 | Discharge: 2022-03-18 | Disposition: A | Discharge status: Home / Self Care | Payer: BC Managed Care – PPO | Attending: Emergency Medicine | Admitting: Emergency Medicine

## 2022-03-18 DIAGNOSIS — Y92410 Unspecified street and highway as the place of occurrence of the external cause: Secondary | ICD-10-CM | POA: Diagnosis not present

## 2022-03-18 DIAGNOSIS — Z041 Encounter for examination and observation following transport accident: Secondary | ICD-10-CM | POA: Diagnosis not present

## 2022-03-18 DIAGNOSIS — R0789 Other chest pain: Secondary | ICD-10-CM | POA: Diagnosis not present

## 2022-03-18 DIAGNOSIS — R079 Chest pain, unspecified: Secondary | ICD-10-CM | POA: Diagnosis not present

## 2022-03-18 DIAGNOSIS — R519 Headache, unspecified: Secondary | ICD-10-CM | POA: Diagnosis not present

## 2022-03-18 DIAGNOSIS — M542 Cervicalgia: Secondary | ICD-10-CM | POA: Diagnosis not present

## 2022-03-18 MED ORDER — ACETAMINOPHEN 500 MG PO TABS
1000.0000 mg | ORAL_TABLET | Freq: Once | ORAL | Status: AC
  Administered 2022-03-18: 1000 mg via ORAL
  Filled 2022-03-18: qty 2

## 2022-03-18 NOTE — ED Notes (Signed)
Patient transported to CT 
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
## 2022-03-26 ENCOUNTER — Ambulatory Visit (INDEPENDENT_AMBULATORY_CARE_PROVIDER_SITE_OTHER): Payer: BC Managed Care – PPO | Admitting: Behavioral Health

## 2022-03-26 DIAGNOSIS — F4322 Adjustment disorder with anxiety: Secondary | ICD-10-CM
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
## 2022-03-26 NOTE — Progress Notes (Signed)
                Chad Willis Lanika Colgate, LMFT 

## 2022-04-11 ENCOUNTER — Encounter: Payer: Self-pay | Admitting: Nurse Practitioner

## 2022-04-16 ENCOUNTER — Ambulatory Visit (INDEPENDENT_AMBULATORY_CARE_PROVIDER_SITE_OTHER): Payer: BC Managed Care – PPO | Admitting: Behavioral Health

## 2022-04-16 DIAGNOSIS — F4322 Adjustment disorder with anxiety: Secondary | ICD-10-CM

## 2022-04-16 NOTE — Progress Notes (Signed)
[facility] Counselor/Therapist Progress Note  Patient ID: Chad Willis., MRN: [id number],    Date: 04/16/2022  Time Spent: 55 min In Person @ [facility] - [facility] Office   Treatment Type: Individual Therapy  Reported Symptoms: Reduction in anx/dep & stress due to his inc'd stability around Sch/Work life balance & gaining much maturity in his journey.  Mental Status Exam: Appearance:  Casual     Behavior: Appropriate and Sharing  Motor: Normal  Speech/Language:  Clear and Coherent  Affect: Appropriate  Mood: normal  Thought process: normal  Thought content:   WNL  Sensory/Perceptual disturbances:   WNL  Orientation: oriented to person, place, and time/date  Attention: Good  Concentration: Good  Memory: WNL  Fund of knowledge:  Good  Insight:   Excellent  Judgment:  Good  Impulse Control: Good   Risk Assessment: Danger to Self:  No Self-injurious Behavior: No Danger to Others: No Duty to Warn:no Physical Aggression / Violence:No  Access to Firearms a concern: No  Gang Involvement:No   Subjective: Chad Willis is looking forward to his FT Graduate Sch schedule in Jan. He is excited for his work responsibilities & the upcoming Thanksgiving Holiday around his Family. He is missing his Mother & trying to keep his perspective as he will see his Fr & Bros who both express deep loss.   Interventions: Insight-Oriented  Diagnosis:Adjustment disorder with anxious mood  Plan: Chad Willis feels he is improving in relationships w/others as he navigates his life plan @ work & school. We will take appts out one month & meet again prior to Christmas for a check-in. Chad Willis feels it important to have the space in Therapy to express himself & process his journey.  Target Date: 05/16/2022  Progress: 7  Frequency: Once monthly  Modality: Claretta Fraise, LMFT

## 2022-04-16 NOTE — Progress Notes (Signed)
                Andreia Gandolfi L Marzelle Rutten, LMFT 

## 2022-04-29 ENCOUNTER — Ambulatory Visit: Payer: BC Managed Care – PPO | Admitting: Nurse Practitioner

## 2022-04-30 ENCOUNTER — Encounter: Payer: Self-pay | Admitting: Nurse Practitioner

## 2022-04-30 ENCOUNTER — Ambulatory Visit: Payer: BC Managed Care – PPO | Admitting: Nurse Practitioner

## 2022-04-30 VITALS — BP 98/72 | HR 64 | Temp 97.6°F | Wt 231.4 lb

## 2022-04-30 DIAGNOSIS — J4540 Moderate persistent asthma, uncomplicated: Secondary | ICD-10-CM

## 2022-04-30 DIAGNOSIS — R110 Nausea: Secondary | ICD-10-CM

## 2022-04-30 DIAGNOSIS — K580 Irritable bowel syndrome with diarrhea: Secondary | ICD-10-CM

## 2022-04-30 MED ORDER — ONDANSETRON HCL 4 MG PO TABS: 4.0000 mg | ORAL_TABLET | Freq: Three times a day (TID) | ORAL | 0 refills | Status: DC | PRN

## 2022-04-30 MED ORDER — QVAR REDIHALER 40 MCG/ACT IN AERB: 2.0000 | INHALATION_SPRAY | Freq: Two times a day (BID) | RESPIRATORY_TRACT | 6 refills | Status: DC

## 2022-04-30 NOTE — Patient Instructions (Signed)
It was great to see you!  Keep up the great work!  I have refilled your qvar and sent in some nausea medicine to take as needed.   Let's follow-up in 6 months, sooner if you have concerns. Also need nurse visit in February for 3rd HPV vaccine  If a referral was placed today, you will be contacted for an appointment. Please note that routine referrals can sometimes take up to 3-4 weeks to process. Please call our office if you haven't heard anything after this time frame.  Take care,  Rodman Pickle, NP

## 2022-04-30 NOTE — Assessment & Plan Note (Signed)
He has been experiencing intermittent nausea, especially if he has not eaten for several hours.  Will have him start Zofran 4 mg 3 times daily as needed.  Encouraged him to eat regularly.  Follow-up as symptoms worsen or do not improve.

## 2022-04-30 NOTE — Assessment & Plan Note (Signed)
Chronic, stable.  He is not experiencing any diarrhea or blood in his stool the past few weeks.  Follow-up if symptoms worsen or with any concerns.

## 2022-04-30 NOTE — Progress Notes (Signed)
Established Patient Office Visit  Subjective   Patient ID: Chad Stthomas., male    DOB: 05-17-1996  Age: 26 y.o. MRN: [id number]  Chief Complaint  Patient presents with   Follow-up    GI issues have improved. Spot on tongue    HPI  Chad Willis. Is here to follow-up on IBS and a spot on his tongue.   He states his IBS symptoms are improving. He has been trying to improve his diet. He states that he has been experiencing nausea intermittnetly. This typically happens when he hasn't eaten for a few hours.  He denies constipation, diarrhea, blood in his stool, vomiting.  He states that his asthma is well-controlled.  He is using the Qvar inhaler twice a day.  He has not needed to use his albuterol since starting Qvar.  He needs a refill on the Qvar.  He states that the thrush has improved and has not had any recurrence.    ROS See pertinent positives and negatives per HPI.    Objective:     BP 98/72 (BP Location: Right Arm, Patient Position: Sitting, Cuff Size: Normal)   Pulse 64   Temp 97.6 F (36.4 C) (Oral)   Wt 231 lb 6.4 oz (105 kg)   SpO2 99%   BMI 31.38 kg/m    Physical Exam Vitals and nursing note reviewed.  Constitutional:      Appearance: Normal appearance.  HENT:     Head: Normocephalic.  Eyes:     Conjunctiva/sclera: Conjunctivae normal.  Cardiovascular:     Rate and Rhythm: Normal rate and regular rhythm.     Pulses: Normal pulses.     Heart sounds: Normal heart sounds.  Pulmonary:     Effort: Pulmonary effort is normal.     Breath sounds: Normal breath sounds.  Musculoskeletal:     Cervical back: Normal range of motion.  Skin:    General: Skin is warm.  Neurological:     General: No focal deficit present.     Mental Status: He is alert and oriented to person, place, and time.  Psychiatric:        Mood and Affect: Mood normal.        Behavior: Behavior normal.        Thought Content: Thought content normal.         Judgment: Judgment normal.      Assessment & Plan:   Problem List Items Addressed This Visit       Respiratory   Asthma - Primary    Chronic, stable.  Continue Qvar inhaler twice a day.  Refill sent to the pharmacy.  Follow-up in 6 months.      Relevant Medications   beclomethasone (QVAR REDIHALER) 40 MCG/ACT inhaler     Digestive   Irritable bowel syndrome with diarrhea    Chronic, stable.  He is not experiencing any diarrhea or blood in his stool the past few weeks.  Follow-up if symptoms worsen or with any concerns.      Relevant Medications   ondansetron (ZOFRAN) 4 MG tablet     Other   Nausea    He has been experiencing intermittent nausea, especially if he has not eaten for several hours.  Will have him start Zofran 4 mg 3 times daily as needed.  Encouraged him to eat regularly.  Follow-up as symptoms worsen or do not improve.       Return in about 6 months (around 10/30/2022) for  asthma.    Gerre Scull, NP

## 2022-04-30 NOTE — Assessment & Plan Note (Addendum)
Chronic, stable.  Continue Qvar inhaler twice a day.  Refill sent to the pharmacy.  Follow-up in 6 months.

## 2022-05-14 ENCOUNTER — Ambulatory Visit: Payer: BC Managed Care – PPO | Admitting: Behavioral Health

## 2022-05-14 DIAGNOSIS — F4322 Adjustment disorder with anxiety: Secondary | ICD-10-CM

## 2022-05-14 DIAGNOSIS — F4321 Adjustment disorder with depressed mood: Secondary | ICD-10-CM

## 2022-05-14 NOTE — Progress Notes (Signed)
                Antwine Agosto L Kaidan Spengler, LMFT 

## 2022-05-14 NOTE — Progress Notes (Signed)
[facility] Counselor/Therapist Progress Note  Patient ID: Chad Coy., MRN: [id number],    Date: 05/14/2022  Time Spent: 55 min In Person @ [facility] - [facility] Office   Treatment Type: Individual Therapy  Reported Symptoms: Reduction in anx/dep & improved movement through the Holidays w/his Family since the death of his Mother  Mental Status Exam: Appearance:  Casual     Behavior: Appropriate and Sharing  Motor: Normal  Speech/Language:  Clear and Coherent and Normal Rate  Affect: Appropriate  Mood: normal  Thought process: normal  Thought content:   WNL  Sensory/Perceptual disturbances:   WNL  Orientation: oriented to person, place, and time/date  Attention: Good  Concentration: Good  Memory: WNL  Fund of knowledge:  Good  Insight:   Good  Judgment:  Good  Impulse Control: Good   Risk Assessment: Danger to Self:  No Self-injurious Behavior: No Danger to Others: No Duty to Warn:no Physical Aggression / Violence:No  Access to Firearms a concern: No  Gang Involvement:No   Subjective: Pt is upbeat today & excited for his pending notification of official 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    Interventions: Solution-Oriented/Positive Psychology and Grief Therapy  Diagnosis:Adjustment disorder with anxious mood  Grief reaction  Plan: Chad Willis wants to maintain his time in psychotherapy to process through this pivotal time for his personal growth. He is hoping to keep a healthy balance btwn Sch-Work-Family. He will stay open to his present situation & learn as he lives through it. He is being kind to others & realizes he will have mixed & multiple emotions about his Mother's absence.  Target Date: 06/14/2022  Progress: 7  Frequency: Once monthly   Modality: Claretta Fraise, LMFT

## 2022-05-23 DIAGNOSIS — Z8709 Personal history of other diseases of the respiratory system: Secondary | ICD-10-CM | POA: Diagnosis not present

## 2022-05-23 DIAGNOSIS — B349 Viral infection, unspecified: Secondary | ICD-10-CM | POA: Diagnosis not present

## 2022-06-11 ENCOUNTER — Ambulatory Visit (INDEPENDENT_AMBULATORY_CARE_PROVIDER_SITE_OTHER): Payer: BC Managed Care – PPO | Admitting: Behavioral Health

## 2022-06-11 DIAGNOSIS — F4322 Adjustment disorder with anxiety: Secondary | ICD-10-CM

## 2022-06-11 DIAGNOSIS — F4321 Adjustment disorder with depressed mood: Secondary | ICD-10-CM | POA: Diagnosis not present

## 2022-06-11 NOTE — Progress Notes (Signed)
[place] Counselor/Therapist Progress Note  Patient ID: Chad Willis., MRN: [id number],    Date: 06/11/2022  Time Spent: 11 min In Person @ [facility] - [facility] Office   Treatment Type: Individual Therapy  Reported Symptoms: Reduction in anx/dep & grief due to the start of his Graduate Prog @ [employer] & his efforts to know & improve his self-confidence & self-awareness.  Pt felt the absence of his Mother this past 24 & has accepted he may be expected to play the role of keeping his Family tgthr & cohesive.   Mental Status Exam: Appearance:  Casual     Behavior: Appropriate and Sharing  Motor: Normal  Speech/Language:  Clear and Coherent and Normal Rate  Affect: Appropriate  Mood: normal  Thought process: normal  Thought content:   WNL  Sensory/Perceptual disturbances:   WNL  Orientation: oriented to person, place, and time/date  Attention: Good  Concentration: Good  Memory: WNL  Fund of knowledge:  Good  Insight:   Good  Judgment:  Good  Impulse Control: Good   Risk Assessment: Danger to Self:  No Self-injurious Behavior: No Danger to Others: No Duty to Warn:no Physical Aggression / Violence:No  Access to Firearms a concern: No  Gang Involvement:No   Subjective: Pt is doing well w/the start of the New Year. He was accepted into his Graduate Prog @ [employer] & is excited for this fresh beginning.    Interventions: Solution-Oriented/Positive Psychology & psychoedu re: the exp of Graduate Sch. Grief Therapy for loss of Mother in 2023.  Diagnosis:Adjustment disorder with anxious mood  Grief reaction  Plan: Chad Willis will cont to acknowledge his feelings & his grief so he can process these in therapy twice monthly.  Target Date: 07/12/2022  Progress: 7  Frequency: Twice monthly  Modality: Boykin Reaper, LMFT

## 2022-06-11 NOTE — Progress Notes (Signed)
                Anfernee Peschke L Brownie Gockel, LMFT 

## 2022-06-25 ENCOUNTER — Ambulatory Visit: Payer: BC Managed Care – PPO | Admitting: Behavioral Health

## 2022-06-25 DIAGNOSIS — F4322 Adjustment disorder with anxiety: Secondary | ICD-10-CM | POA: Diagnosis not present

## 2022-06-25 NOTE — Progress Notes (Unsigned)
                Chad Willis L Dhaval Woo, LMFT 

## 2022-06-25 NOTE — Progress Notes (Unsigned)
[place] Counselor/Therapist Progress Note  Patient ID: Cosme Jacob., MRN: [id number],    Date: 06/25/2022  Time Spent: 38 min In person @ [facility] - [facility] Office   Treatment Type: Individual Therapy  Reported Symptoms: Reduction in anx/dep & grief. 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  Mental Status Exam: Appearance:  Casual     Behavior: Appropriate and Sharing  Motor: Normal  Speech/Language:  Clear and Coherent and Normal Rate  Affect: Appropriate  Mood: normal  Thought process: normal  Thought content:   WNL  Sensory/Perceptual disturbances:   WNL  Orientation: oriented to person, place, and time/date  Attention: Good  Concentration: Good  Memory: WNL  Fund of knowledge:  Good  Insight:   Good  Judgment:  Good  Impulse Control: Good   Risk Assessment: Danger to Self:  No Self-injurious Behavior: No Danger to Others: No Duty to Warn:no Physical Aggression / Violence:No  Access to Firearms a concern: No  Gang Involvement:No   Subjective: Pt is in positive mood today, energized by Graduate Sch & looking forward to his [employer]. His concerns about his male friend have inc'd as they have been intimate recently.    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 He is trying to motivate ppl he relates w/at work & this is going well. He is striving for a healthy lifestyle. He will cont to add positive beh & turn them into habits. We have examined terminating therapy & Kelsey feels comfortable w/this knowing he can schedule another visit in the future & return prn.   Donnetta Hutching, LMFT

## 2022-07-05 NOTE — Progress Notes (Signed)
After obtaining consent, and per orders of Lauren McElwee, injection of Gardasil given IM in the left deltoid by Angeline Slim. Pt also requested and received Fluarix IM in the right deltoid by Angeline Slim. Copy of [facility] given to patient for his records. Patient instructed to remain in clinic for 20 minutes afterwards, and to report any adverse reaction to me immediately.

## 2022-07-16 ENCOUNTER — Ambulatory Visit (INDEPENDENT_AMBULATORY_CARE_PROVIDER_SITE_OTHER): Payer: BC Managed Care – PPO

## 2022-07-16 DIAGNOSIS — Z23 Encounter for immunization: Secondary | ICD-10-CM | POA: Diagnosis not present

## 2022-07-30 ENCOUNTER — Ambulatory Visit: Payer: Medicaid Other | Admitting: Behavioral Health

## 2022-07-30 DIAGNOSIS — F4322 Adjustment disorder with anxiety: Secondary | ICD-10-CM

## 2022-07-30 DIAGNOSIS — F4321 Adjustment disorder with depressed mood: Secondary | ICD-10-CM | POA: Diagnosis not present

## 2022-07-30 NOTE — Progress Notes (Signed)
                Micharl Helmes L Anae Hams, LMFT 

## 2022-07-30 NOTE — Progress Notes (Signed)
[facility] Counselor/Therapist Progress Note  Patient ID: Chad Willis., MRN: [id number],    Date: 07/30/2022  Time Spent: 43 min In Person @ [facility] Office   Treatment Type: Individual Therapy  Reported Symptoms: Pt has elevated anx/dep & stress today due to the last few wks & the events that have prompted him to review his life goals & his moral compass  Mental Status Exam: Appearance:  Casual     Behavior: Appropriate and Sharing  Motor: Normal  Speech/Language:  Clear and Coherent  Affect: Tearful  Mood: sad  Thought process: normal  Thought content:   WNL  Sensory/Perceptual disturbances:   WNL  Orientation: oriented to person, place, and time/date  Attention: Good  Concentration: Good  Memory: WNL  Fund of knowledge:  Good  Insight:   Good  Judgment:  Good  Impulse Control: Good   Risk Assessment: Danger to Self:  No Self-injurious Behavior: No Danger to Others: No Duty to Warn:no Physical Aggression / Violence:No  Access to Firearms a concern: No  Gang Involvement:No   Subjective: Pt is upset today & feels overwhelm & confusion over his friends disclosure to him about their relationship. She & Pt are having many discussions & arguing a lot as they make pivotal life decisions.    Interventions: Insight Oriented & Family Systems  Diagnosis:Adjustment disorder with anxious mood  Grief reaction  Plan: Thurmond has elevated anx/dep & stress today. He is considering many important aspects of his relationship w/a friend he has known since [employer]. They are facing life questions & decisions that are impactful. He will speak w/his Father this Friday & include his Mother in the discussion as he tries to solve the dilemma.   Target Date:  08/30/2022  Progress: 5  Frequency: Twice monthly  Modality: Boykin Reaper, LMFT

## 2022-08-20 ENCOUNTER — Ambulatory Visit: Payer: Medicaid Other | Admitting: Behavioral Health

## 2022-08-20 NOTE — Progress Notes (Unsigned)
                Veronique Warga L Emmaleigh Longo, LMFT 

## 2022-08-26 ENCOUNTER — Ambulatory Visit: Payer: Medicaid Other | Admitting: Behavioral Health

## 2022-09-01 ENCOUNTER — Emergency Department (HOSPITAL_COMMUNITY): Payer: BC Managed Care – PPO

## 2022-09-01 ENCOUNTER — Encounter (HOSPITAL_COMMUNITY): Payer: Self-pay | Admitting: Emergency Medicine

## 2022-09-01 ENCOUNTER — Emergency Department (HOSPITAL_COMMUNITY)
Admission: EM | Admit: 2022-09-01 | Discharge: 2022-09-01 | Disposition: A | Discharge status: Home / Self Care | Payer: BC Managed Care – PPO | Attending: Emergency Medicine | Admitting: Emergency Medicine

## 2022-09-01 ENCOUNTER — Other Ambulatory Visit: Payer: Self-pay

## 2022-09-01 DIAGNOSIS — J45909 Unspecified asthma, uncomplicated: Secondary | ICD-10-CM | POA: Diagnosis not present

## 2022-09-01 DIAGNOSIS — R55 Syncope and collapse: Secondary | ICD-10-CM | POA: Diagnosis not present

## 2022-09-01 DIAGNOSIS — E876 Hypokalemia: Secondary | ICD-10-CM | POA: Diagnosis not present

## 2022-09-01 DIAGNOSIS — F411 Generalized anxiety disorder: Secondary | ICD-10-CM | POA: Insufficient documentation

## 2022-09-01 LAB — CBC WITH DIFFERENTIAL/PLATELET
Abs Immature Granulocytes: 0.01 10*3/uL (ref 0.00–0.07)
Basophils Absolute: 0.1 10*3/uL (ref 0.0–0.1)
Basophils Relative: 1 %
Eosinophils Absolute: 0.1 10*3/uL (ref 0.0–0.5)
Eosinophils Relative: 4 %
HCT: 45.7 % (ref 39.0–52.0)
Hemoglobin: 14.5 g/dL (ref 13.0–17.0)
Immature Granulocytes: 0 %
Lymphocytes Relative: 35 %
Lymphs Abs: 1.4 10*3/uL (ref 0.7–4.0)
MCH: 28.8 pg (ref 26.0–34.0)
MCHC: 31.7 g/dL (ref 30.0–36.0)
MCV: 90.9 fL (ref 80.0–100.0)
Monocytes Absolute: 0.3 10*3/uL (ref 0.1–1.0)
Monocytes Relative: 7 %
Neutro Abs: 2.1 10*3/uL (ref 1.7–7.7)
Neutrophils Relative %: 53 %
Platelets: 269 10*3/uL (ref 150–400)
RBC: 5.03 MIL/uL (ref 4.22–5.81)
RDW: 12.6 % (ref 11.5–15.5)
WBC: 3.9 10*3/uL — ABNORMAL LOW (ref 4.0–10.5)
nRBC: 0 % (ref 0.0–0.2)

## 2022-09-01 LAB — COMPREHENSIVE METABOLIC PANEL
ALT: 16 U/L (ref 0–44)
AST: 15 U/L (ref 15–41)
Albumin: 4.2 g/dL (ref 3.5–5.0)
Alkaline Phosphatase: 48 U/L (ref 38–126)
Anion gap: 11 (ref 5–15)
BUN: 6 mg/dL (ref 6–20)
CO2: 24 mmol/L (ref 22–32)
Calcium: 9.5 mg/dL (ref 8.9–10.3)
Chloride: 104 mmol/L (ref 98–111)
Creatinine, Ser: 1.16 mg/dL (ref 0.61–1.24)
GFR, Estimated: >60 mL/min (ref >60)
Glucose, Bld: 96 mg/dL (ref 70–99)
Potassium: 3 mmol/L — ABNORMAL LOW (ref 3.5–5.1)
Sodium: 139 mmol/L (ref 135–145)
Total Bilirubin: 1.9 mg/dL — ABNORMAL HIGH (ref 0.3–1.2)
Total Protein: 7.1 g/dL (ref 6.5–8.1)

## 2022-09-01 MED ORDER — POTASSIUM CHLORIDE CRYS ER 20 MEQ PO TBCR
40.0000 meq | EXTENDED_RELEASE_TABLET | Freq: Once | ORAL | Status: AC
  Administered 2022-09-01: 40 meq via ORAL
  Filled 2022-09-01: qty 2

## 2022-09-01 MED ORDER — ACETAMINOPHEN 325 MG PO TABS
650.0000 mg | ORAL_TABLET | Freq: Once | ORAL | Status: AC
  Administered 2022-09-01: 650 mg via ORAL
  Filled 2022-09-01: qty 2

## 2022-09-01 MED ORDER — SODIUM CHLORIDE 0.9 % IV BOLUS
1000.0000 mL | Freq: Once | INTRAVENOUS | Status: AC
  Administered 2022-09-01: 1000 mL via INTRAVENOUS

## 2022-09-01 NOTE — ED Notes (Signed)
AVS reviewed with pt prior to discharge. Pt verbalizes understanding. Belongings with pt upon depart. Ambulatory to lobby to wait for ride.  

## 2022-09-01 NOTE — ED Triage Notes (Signed)
Per [facility] pt coming from work after having a witnessed syncopal episode. Coworker states patient was out for about 3 minutes. Patient states when he woke up he had blurred vision and trouble hearing. Patient reports having a ringing in his left ear for past couple days. Patient states he has been under a lot of stress with work, school and home life.

## 2022-09-01 NOTE — ED Provider Notes (Signed)
[facility] EMERGENCY DEPARTMENT AT [facility] Provider Note   CSN: [id number] Arrival date & time: 09/01/22  1326     History  Chief Complaint  Patient presents with   Loss of Consciousness    Chad Willis. is a 27 y.o. male.  Patient is a 27 year old male with a past medical history of asthma and anxiety presenting to the emergency department with syncope.  The patient states that he has been under significant amount of stress recently after finding out that his girlfriend was pregnant [redacted] weeks ago.  He states that he was stressed and crying last night and did not go to bed until 2 AM.  He states that when he woke up this morning he felt generally unwell and weak however still went to work.  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  He denied any chest pain or shortness of breath but did state that he had some tightness and shortness of breath while crying last night that he needed his albuterol for.  He is unsure if he hit his head but denies any headaches.  He states that he initially could not move his arms or legs when he first came to but now states that his strength and sensation is normal in all arms and legs. Patient states he's had passive SI, no active SI or plan at this time. States he does see a therapist and has the number for [facility] for follow up.  The history is provided by the patient.  Loss of Consciousness      Home Medications Prior to Admission medications   Medication Sig Start Date End Date Taking? Authorizing Provider  albuterol (VENTOLIN HFA) 108 (90 Base) MCG/ACT inhaler Inhale 2 puffs into the lungs every 6 (six) hours as needed for wheezing or shortness of breath. 12/28/21   McElwee, Lauren A, NP  beclomethasone (QVAR REDIHALER) 40 MCG/ACT inhaler Inhale 2 puffs into the lungs 2 (two) times daily. 04/30/22    McElwee, Lauren A, NP  hyoscyamine (LEVSIN SL) 0.125 MG SL tablet Place 1 tablet (0.125 mg total) under the tongue every 4 (four) hours as needed. 01/15/22   Zehr, Princella Pellegrini, PA-C  nystatin (MYCOSTATIN) 100000 UNIT/ML suspension Take 5 mLs (500,000 Units total) by mouth 4 (four) times daily. 02/15/22   McElwee, Lauren A, NP  ondansetron (ZOFRAN) 4 MG tablet Take 1 tablet (4 mg total) by mouth every 8 (eight) hours as needed for nausea or vomiting. 04/30/22   McElwee, Jake Church, NP      Allergies    Amoxicillin    Review of Systems   Review of Systems  Cardiovascular:  Positive for syncope.    Physical Exam Updated Vital Signs BP 117/72 (BP Location: Right Arm)   Pulse 86   Temp 98.1 F (36.7 C) (Oral)   Resp 18   Ht 6' (1.829 m)   Wt 104.8 kg   SpO2 100%   BMI 31.33 kg/m  Physical Exam Vitals and nursing note reviewed.  Constitutional:      General: He is not in acute distress.    Appearance: Normal appearance.  HENT:     Head: Normocephalic and atraumatic.     Nose: Nose normal.     Mouth/Throat:     Mouth: Mucous membranes are moist.     Pharynx:  Oropharynx is clear.  Eyes:     Extraocular Movements: Extraocular movements intact.     Conjunctiva/sclera: Conjunctivae normal.  Cardiovascular:     Rate and Rhythm: Normal rate and regular rhythm.     Heart sounds: Normal heart sounds.  Pulmonary:     Effort: Pulmonary effort is normal.     Breath sounds: Normal breath sounds.  Abdominal:     General: Abdomen is flat.     Palpations: Abdomen is soft.     Tenderness: There is no abdominal tenderness.  Musculoskeletal:        General: No tenderness or deformity. Normal range of motion.     Cervical back: Normal range of motion and neck supple.     Right lower leg: No edema.     Left lower leg: No edema.  Skin:    General: Skin is warm and dry.  Neurological:     General: No focal deficit present.     Mental Status: He is alert and oriented to person, place, and time.      Cranial Nerves: No cranial nerve deficit.     Sensory: No sensory deficit.     Motor: No weakness.  Psychiatric:        Behavior: Behavior normal.     Comments: Depressed/anxious mood     ED Results / Procedures / Treatments   Labs (all labs ordered are listed, but only abnormal results are displayed) Labs Reviewed  COMPREHENSIVE METABOLIC PANEL - Abnormal; Notable for the following components:      Result Value   Potassium 3.0 (*)    Total Bilirubin 1.9 (*)    All other components within normal limits  CBC WITH DIFFERENTIAL/PLATELET - Abnormal; Notable for the following components:   WBC 3.9 (*)    All other components within normal limits    EKG None  Radiology DG Chest 1 View  Result Date: 09/01/2022 CLINICAL DATA:  syncope EXAM: CHEST  1 VIEW COMPARISON:  March 18, 2022 FINDINGS: The cardiomediastinal silhouette is normal in contour. No pleural effusion. No pneumothorax. No acute pleuroparenchymal abnormality. Visualized abdomen is unremarkable. No acute osseous abnormality noted. IMPRESSION: No acute cardiopulmonary abnormality. Electronically Signed   By: Meda KlinefelterStephanie  Peacock M.D.   On: 09/01/2022 14:50    Procedures Procedures    Medications Ordered in ED Medications  potassium chloride SA (KLOR-CON M) CR tablet 40 mEq (has no administration in time range)  sodium chloride 0.9 % bolus 1,000 mL (1,000 mLs Intravenous New Bag/Given 09/01/22 1444)  acetaminophen (TYLENOL) tablet 650 mg (650 mg Oral Given 09/01/22 1439)    ED Course/ Medical Decision Making/ A&P                             Medical Decision Making This patient presents to the ED with chief complaint(s) of syncope with pertinent past medical history of anxiety, asthma which further complicates the presenting complaint. The complaint involves an extensive differential diagnosis and also carries with it a high risk of complications and morbidity.    The differential diagnosis includes ACS, arrhythmia,  anemia, electrolyte abnormality, dehydration, patient has no signs of injuries from his syncopal event, vasovagal  Additional history obtained: Additional history obtained from N/A Records reviewed Primary Care Documents  ED Course and Reassessment: On patient's arrival he is well-appearing in no acute distress.  EKG on arrival showed normal sinus rhythm without ischemic changes.  Patient will have labs performed to  evaluate for potential causes of his syncope.  He will be given IV fluids and will be closely reassessed.  Independent labs interpretation:  The following labs were independently interpreted: Mild hypokalemia otherwise within normal range  Independent visualization of imaging: - I independently visualized the following imaging with scope of interpretation limited to determining acute life threatening conditions related to emergency care: Chest x-ray, which revealed no acute disease  Consultation: - Consulted or discussed management/test interpretation w/ external professional: N/A  Consideration for admission or further workup: Patient has no emergent conditions requiring admission or further work-up at this time and is stable for discharge home with primary care follow-up  Social Determinants of health: N/A    Amount and/or Complexity of Data Reviewed Labs: ordered. Radiology: ordered.  Risk OTC drugs.          Final Clinical Impression(s) / ED Diagnoses Final diagnoses:  Syncope, unspecified syncope type  Hypokalemia    Rx / DC Orders ED Discharge Orders     None         Faust, Dockstader, DO 09/01/22 1528

## 2022-09-01 NOTE — Discharge Instructions (Signed)
You were seen in the emergency department after passing out.  Your potassium level was mildly low likely due to from not eating as much recently but you had no signs of severe dehydration or abnormal heart rhythms.  You should continue to eat and drink normally to stay well-hydrated and can follow-up with your primary doctor to have your symptoms rechecked as well as follow-up with your therapist at behavioral health for your increased stressors recently.  You should return to the emergency department if you have recurrent episodes of passing out, severe chest pain, you having thoughts of wanting to hurt yourself or others or if you have any other new or concerning symptoms.

## 2022-09-13 ENCOUNTER — Encounter: Payer: Self-pay | Admitting: Nurse Practitioner

## 2022-09-13 ENCOUNTER — Other Ambulatory Visit (HOSPITAL_COMMUNITY)
Admission: RE | Admit: 2022-09-13 | Discharge: 2022-09-13 | Disposition: A | Discharge status: Home / Self Care | Payer: BC Managed Care – PPO | Source: Ambulatory Visit | Attending: Nurse Practitioner | Admitting: Nurse Practitioner

## 2022-09-13 ENCOUNTER — Ambulatory Visit (INDEPENDENT_AMBULATORY_CARE_PROVIDER_SITE_OTHER): Payer: BC Managed Care – PPO | Admitting: Nurse Practitioner

## 2022-09-13 VITALS — BP 110/80 | HR 51 | Temp 97.6°F | Ht 72.0 in | Wt 218.0 lb

## 2022-09-13 DIAGNOSIS — Z113 Encounter for screening for infections with a predominantly sexual mode of transmission: Secondary | ICD-10-CM | POA: Diagnosis present

## 2022-09-13 DIAGNOSIS — J4540 Moderate persistent asthma, uncomplicated: Secondary | ICD-10-CM

## 2022-09-13 DIAGNOSIS — F419 Anxiety disorder, unspecified: Secondary | ICD-10-CM | POA: Insufficient documentation

## 2022-09-13 DIAGNOSIS — E876 Hypokalemia: Secondary | ICD-10-CM | POA: Diagnosis not present

## 2022-09-13 DIAGNOSIS — F32A Depression, unspecified: Secondary | ICD-10-CM

## 2022-09-13 LAB — BASIC METABOLIC PANEL
BUN: 7 mg/dL (ref 6–23)
CO2: 29 mEq/L (ref 19–32)
Calcium: 9.6 mg/dL (ref 8.4–10.5)
Chloride: 103 mEq/L (ref 96–112)
Creatinine, Ser: 1.18 mg/dL (ref 0.40–1.50)
GFR: 85.29 mL/min (ref >60.00)
Glucose, Bld: 89 mg/dL (ref 70–99)
Potassium: 3.7 mEq/L (ref 3.5–5.1)
Sodium: 141 mEq/L (ref 135–145)

## 2022-09-13 MED ORDER — SERTRALINE HCL 25 MG PO TABS: 25.0000 mg | ORAL_TABLET | Freq: Every day | ORAL | 1 refills | Status: DC

## 2022-09-13 MED ORDER — QVAR REDIHALER 40 MCG/ACT IN AERB: 2.0000 | INHALATION_SPRAY | Freq: Two times a day (BID) | RESPIRATORY_TRACT | 6 refills | Status: AC

## 2022-09-13 NOTE — Patient Instructions (Addendum)
It was great to see you!  We are checking your labs today and will let you know the results via mychart/phone.   Start zoloft 1 tablet daily.   Keep going to the therapist.   I have refilled your qvar inhaler to use twice a day.   Let's follow-up in 4-6 weeks, sooner if you have concerns.  If a referral was placed today, you will be contacted for an appointment. Please note that routine referrals can sometimes take up to 3-4 weeks to process. Please call our office if you haven't heard anything after this time frame.  Take care,  Rodman Pickle, NP

## 2022-09-13 NOTE — Assessment & Plan Note (Signed)
Chronic, not controlled.  He has been having more anxiety and stress since finding out that her prior partner is pregnant with his child.  He denies SI/HI.  He has been having some questionable panic attacks versus asthma flareups.  His PHQ-9 is a 12 and his GAD-7 is a 10.  He denies SI/HI.  Will have him start Zoloft 25 mg daily.  Discussed possible side effects.  Follow-up in 4 to 6 weeks.

## 2022-09-13 NOTE — Assessment & Plan Note (Signed)
Chronic, ongoing.  He never picked up the Qvar inhaler and states that his asthma has worsened recently.  He is describing episodes of shortness of breath and chest tightness.  He has had increased stress recently, question if he is having increased anxiety or if this is a panic attack.  Will refill his Qvar and instructed him to pick this up at the pharmacy.  Continue albuterol inhaler as needed.

## 2022-09-13 NOTE — Progress Notes (Signed)
Established Patient Office Visit  Subjective   Patient ID: Chad Levee., male    DOB: 08-05-1995  Age: 27 y.o. MRN: [id number]  Chief Complaint  Patient presents with   Asthma    Follow up, request lab work    HPI  [employer]. Is here to follow-up with asthma and grief.   He did start going to therapy and has an appointment next week.  He feels like a first appointment with this therapist was helpful.  He has been having more stress and anxiety since finding out a prior partner is pregnant with his baby.  He had a syncopal episode at work from increased stress and not eating.  He states that his asthma has gotten slightly worse and he is having episodes of shortness of breath and chest tightness.  He did not pick up the Qvar.  He has been using his albuterol inhaler 2-3 times per week.  He denies SI/HI.     09/13/2022    9:35 AM 03/11/2022   10:09 AM 11/26/2021    3:20 PM  Depression screen PHQ 2/9  Decreased Interest Down, Depressed, Hopeless 2 0 1  PHQ - 2 Score Altered sleeping Tired, decreased energy Change in appetite Feeling bad or failure about yourself  2 0 1  Trouble concentrating 1 1 0  Moving slowly or fidgety/restless 1 1 0  Suicidal thoughts 1 0 0  PHQ-9 Score Difficult doing work/chores Somewhat difficult Not difficult at all Somewhat difficult      09/13/2022    9:35 AM 03/11/2022   10:10 AM 11/26/2021    3:20 PM  GAD 7 : Generalized Anxiety Score  Nervous, Anxious, on Edge Control/stop worrying 3 0 1  Worry too much - different things Trouble relaxing 1 0 1  Restless Easily annoyed or irritable 1 0 1  Afraid - awful might happen 1 0 0  Total GAD 7 Score Anxiety Difficulty Somewhat difficult Not difficult at all Not difficult at all      ROS See pertinent positives and negatives per HPI.    Objective:     BP 110/80 (BP Location: Right Arm)   Pulse  (!) 51   Temp 97.6 F (36.4 C)   Ht 6' (1.829 m)   Wt 218 lb (98.9 kg)   SpO2 98%   BMI 29.57 kg/m    Physical Exam Vitals and nursing note reviewed.  Constitutional:      Appearance: Normal appearance.  HENT:     Head: Normocephalic.  Eyes:     Conjunctiva/sclera: Conjunctivae normal.  Cardiovascular:     Rate and Rhythm: Normal rate and regular rhythm.     Pulses: Normal pulses.     Heart sounds: Normal heart sounds.  Pulmonary:     Effort: Pulmonary effort is normal.     Breath sounds: Normal breath sounds.  Musculoskeletal:     Cervical back: Normal range of motion.  Skin:    General: Skin is warm.  Neurological:     General: No focal deficit present.     Mental Status: He is alert and oriented to person, place, and time.  Psychiatric:        Mood and Affect: Mood normal.  Behavior: Behavior normal.        Thought Content: Thought content normal.        Judgment: Judgment normal.      Results for orders placed or performed in visit on 09/13/22  Basic metabolic panel  Result Value Ref Range   Sodium 141 135 - 145 mEq/L   Potassium 3.7 3.5 - 5.1 mEq/L   Chloride 103 96 - 112 mEq/L   CO2 29 19 - 32 mEq/L   Glucose, Bld 89 70 - 99 mg/dL   BUN 7 6 - 23 mg/dL   Creatinine, Ser 1.61 0.40 - 1.50 mg/dL   GFR 09.60 >45.40 mL/min   Calcium 9.6 8.4 - 10.5 mg/dL      Assessment & Plan:   Problem List Items Addressed This Visit       Respiratory   Asthma    Chronic, ongoing.  He never picked up the Qvar inhaler and states that his asthma has worsened recently.  He is describing episodes of shortness of breath and chest tightness.  He has had increased stress recently, question if he is having increased anxiety or if this is a panic attack.  Will refill his Qvar and instructed him to pick this up at the pharmacy.  Continue albuterol inhaler as needed.      Relevant Medications   beclomethasone (QVAR REDIHALER) 40 MCG/ACT inhaler     Other   Anxiety and  depression - Primary    Chronic, not controlled.  He has been having more anxiety and stress since finding out that her prior partner is pregnant with his child.  He denies SI/HI.  He has been having some questionable panic attacks versus asthma flareups.  His PHQ-9 is a 12 and his GAD-7 is a 10.  He denies SI/HI.  Will have him start Zoloft 25 mg daily.  Discussed possible side effects.  Follow-up in 4 to 6 weeks.      Relevant Medications   sertraline (ZOLOFT) 25 MG tablet   Other Visit Diagnoses     Screen for STD (sexually transmitted disease)       Screen for STDs per patient request   Relevant Orders   HIV Antibody (routine testing w rflx)   RPR   Hepatitis C antibody   Urine cytology ancillary only   Hypokalemia       Will recheck BMP since potassium low in hospital.   Relevant Orders   Basic metabolic panel (Completed)       Return in about 4 weeks (around 10/11/2022) for 4-6 weeks , Depression, Anxiety.    Gerre Scull, NP

## 2022-09-14 LAB — HIV ANTIBODY (ROUTINE TESTING W REFLEX): HIV 1&2 Ab, 4th Generation: NONREACTIVE

## 2022-09-14 LAB — RPR: RPR Ser Ql: NONREACTIVE

## 2022-09-14 LAB — HEPATITIS C ANTIBODY: Hepatitis C Ab: NONREACTIVE

## 2022-09-15 MED ORDER — ALBUTEROL SULFATE HFA 108 (90 BASE) MCG/ACT IN AERS: 2.0000 | INHALATION_SPRAY | Freq: Four times a day (QID) | RESPIRATORY_TRACT | 3 refills | Status: AC | PRN

## 2022-09-16 LAB — URINE CYTOLOGY ANCILLARY ONLY
Chlamydia: NEGATIVE
Comment: NEGATIVE
Comment: NORMAL
Neisseria Gonorrhea: NEGATIVE

## 2022-09-23 ENCOUNTER — Ambulatory Visit (INDEPENDENT_AMBULATORY_CARE_PROVIDER_SITE_OTHER): Payer: Medicaid Other | Admitting: Behavioral Health

## 2022-09-23 DIAGNOSIS — F4322 Adjustment disorder with anxiety: Secondary | ICD-10-CM | POA: Diagnosis not present

## 2022-09-23 DIAGNOSIS — F4321 Adjustment disorder with depressed mood: Secondary | ICD-10-CM

## 2022-09-23 NOTE — Progress Notes (Signed)
[facility] Counselor/Therapist Progress Note  Patient ID: Chad Willis., MRN: [id number],    Date: 09/23/2022  Time Spent: 55 min In Person @ [facility] Office   Treatment Type: Individual Therapy  Reported Symptoms: Elevated anx/dep & high stress due to ongoing situation w/male friend who is 12 wks preg  Mental Status Exam: Appearance:  Casual     Behavior: Appropriate and Sharing  Motor: Normal  Speech/Language:  Clear and Coherent  Affect: Appropriate  Mood: anxious  Thought process: normal  Thought content:   WNL  Sensory/Perceptual disturbances:   WNL  Orientation: oriented to person, place, and time/date  Attention: Good  Concentration: Good  Memory: WNL  Fund of knowledge:  Good  Insight:   Good  Judgment:  Good  Impulse Control: Good   Risk Assessment: Danger to Self:  No Self-injurious Behavior: No Danger to Others: No Duty to Warn:no Physical Aggression / Violence:No  Access to Firearms a concern: No  Gang Involvement:No   Subjective: Pt relates the stress he has been under since our last visit. He & his male friend who disclosed she was pregnant have been talking about the situation & they have just gotten to a better place relating to ea other this week. Pt is upset for life plans that will likely be derailed. He is processing today in session to understand himself & the situation more clearly.   Interventions: Insight-Oriented  Diagnosis:Adjustment disorder with anxious mood  Grief reaction  Plan: Chad Willis will get our sessions & his attendance back on track as he finds them helpful. He will cont his efforts w/communication & keep a record of the effectiveness of these interactions for our next visit.  Target Date: 10/24/2022  Progress: 5  Frequency: Once every 3-4 wks  Modality: Claretta Fraise, LMFT

## 2022-09-23 NOTE — Progress Notes (Signed)
                Seryna Marek L Damiano Stamper, LMFT 

## 2022-10-07 ENCOUNTER — Ambulatory Visit: Payer: Medicaid Other | Admitting: Behavioral Health

## 2022-10-07 ENCOUNTER — Encounter: Payer: Self-pay | Admitting: Nurse Practitioner

## 2022-10-11 ENCOUNTER — Ambulatory Visit: Payer: BC Managed Care – PPO | Admitting: Nurse Practitioner

## 2022-10-14 ENCOUNTER — Encounter: Payer: Self-pay | Admitting: Nurse Practitioner

## 2022-10-14 ENCOUNTER — Ambulatory Visit: Payer: Medicaid Other | Admitting: Behavioral Health

## 2022-10-14 ENCOUNTER — Encounter (HOSPITAL_COMMUNITY): Payer: Self-pay | Admitting: Behavioral Health

## 2022-10-14 ENCOUNTER — Ambulatory Visit (INDEPENDENT_AMBULATORY_CARE_PROVIDER_SITE_OTHER): Payer: BC Managed Care – PPO | Admitting: Nurse Practitioner

## 2022-10-14 ENCOUNTER — Ambulatory Visit (HOSPITAL_COMMUNITY)
Admission: EM | Admit: 2022-10-14 | Discharge: 2022-10-15 | Disposition: A | Discharge status: Other Acute Inpatient Hospital | Payer: Medicaid Other | Attending: Behavioral Health | Admitting: Behavioral Health

## 2022-10-14 VITALS — BP 104/76 | HR 65 | Temp 97.0°F | Ht 72.0 in | Wt 195.0 lb

## 2022-10-14 DIAGNOSIS — F32A Depression, unspecified: Secondary | ICD-10-CM

## 2022-10-14 DIAGNOSIS — Z3141 Encounter for fertility testing: Secondary | ICD-10-CM | POA: Diagnosis not present

## 2022-10-14 DIAGNOSIS — F419 Anxiety disorder, unspecified: Secondary | ICD-10-CM | POA: Insufficient documentation

## 2022-10-14 DIAGNOSIS — F332 Major depressive disorder, recurrent severe without psychotic features: Secondary | ICD-10-CM | POA: Diagnosis present

## 2022-10-14 DIAGNOSIS — R45851 Suicidal ideations: Secondary | ICD-10-CM

## 2022-10-14 DIAGNOSIS — F333 Major depressive disorder, recurrent, severe with psychotic symptoms: Secondary | ICD-10-CM | POA: Insufficient documentation

## 2022-10-14 DIAGNOSIS — Z79899 Other long term (current) drug therapy: Secondary | ICD-10-CM | POA: Insufficient documentation

## 2022-10-14 LAB — POCT URINE DRUG SCREEN - MANUAL ENTRY (I-SCREEN)
POC Amphetamine UR: NOT DETECTED
POC Buprenorphine (BUP): NOT DETECTED
POC Cocaine UR: NOT DETECTED
POC Marijuana UR: NOT DETECTED
POC Methadone UR: NOT DETECTED
POC Methamphetamine UR: NOT DETECTED
POC Morphine: NOT DETECTED
POC Oxazepam (BZO): NOT DETECTED
POC Oxycodone UR: NOT DETECTED
POC Secobarbital (BAR): NOT DETECTED

## 2022-10-14 LAB — COMPREHENSIVE METABOLIC PANEL
ALT: 20 U/L (ref 0–44)
AST: 18 U/L (ref 15–41)
Albumin: 4.4 g/dL (ref 3.5–5.0)
Alkaline Phosphatase: 55 U/L (ref 38–126)
Anion gap: 9 (ref 5–15)
BUN: <5 mg/dL — ABNORMAL LOW (ref 6–20)
CO2: 28 mmol/L (ref 22–32)
Calcium: 9.8 mg/dL (ref 8.9–10.3)
Chloride: 101 mmol/L (ref 98–111)
Creatinine, Ser: 0.99 mg/dL (ref 0.61–1.24)
GFR, Estimated: >60 mL/min (ref >60)
Glucose, Bld: 122 mg/dL — ABNORMAL HIGH (ref 70–99)
Potassium: 2.5 mmol/L — CL (ref 3.5–5.1)
Sodium: 138 mmol/L (ref 135–145)
Total Bilirubin: 2.7 mg/dL — ABNORMAL HIGH (ref 0.3–1.2)
Total Protein: 7.6 g/dL (ref 6.5–8.1)

## 2022-10-14 LAB — CBC WITH DIFFERENTIAL/PLATELET
Abs Immature Granulocytes: 0 10*3/uL (ref 0.00–0.07)
Basophils Absolute: 0 10*3/uL (ref 0.0–0.1)
Basophils Relative: 1 %
Eosinophils Absolute: 0.2 10*3/uL (ref 0.0–0.5)
Eosinophils Relative: 5 %
HCT: 44.6 % (ref 39.0–52.0)
Hemoglobin: 14.4 g/dL (ref 13.0–17.0)
Immature Granulocytes: 0 %
Lymphocytes Relative: 42 %
Lymphs Abs: 1.5 10*3/uL (ref 0.7–4.0)
MCH: 28.1 pg (ref 26.0–34.0)
MCHC: 32.3 g/dL (ref 30.0–36.0)
MCV: 86.9 fL (ref 80.0–100.0)
Monocytes Absolute: 0.3 10*3/uL (ref 0.1–1.0)
Monocytes Relative: 7 %
Neutro Abs: 1.6 10*3/uL — ABNORMAL LOW (ref 1.7–7.7)
Neutrophils Relative %: 45 %
Platelets: 324 10*3/uL (ref 150–400)
RBC: 5.13 MIL/uL (ref 4.22–5.81)
RDW: 13.5 % (ref 11.5–15.5)
WBC: 3.5 10*3/uL — ABNORMAL LOW (ref 4.0–10.5)
nRBC: 0 % (ref 0.0–0.2)

## 2022-10-14 LAB — LIPID PANEL
Cholesterol: 155 mg/dL (ref 0–200)
HDL: 48 mg/dL (ref >40)
LDL Cholesterol: 93 mg/dL (ref 0–99)
Total CHOL/HDL Ratio: 3.2 RATIO
Triglycerides: 69 mg/dL (ref <150)
VLDL: 14 mg/dL (ref 0–40)

## 2022-10-14 LAB — ETHANOL: Alcohol, Ethyl (B): <10 mg/dL (ref <10)

## 2022-10-14 LAB — MAGNESIUM: Magnesium: 2.2 mg/dL (ref 1.7–2.4)

## 2022-10-14 LAB — HEMOGLOBIN A1C
Hgb A1c MFr Bld: 5.5 % (ref 4.8–5.6)
Mean Plasma Glucose: 111.15 mg/dL

## 2022-10-14 LAB — TSH: TSH: 0.906 u[IU]/mL (ref 0.350–4.500)

## 2022-10-14 MED ORDER — HYDROXYZINE HCL 25 MG PO TABS: 25.0000 mg | ORAL_TABLET | Freq: Three times a day (TID) | ORAL | Status: DC | PRN

## 2022-10-14 MED ORDER — SERTRALINE HCL 25 MG PO TABS
25.0000 mg | ORAL_TABLET | Freq: Every day | ORAL | Status: DC
  Administered 2022-10-14 – 2022-10-15 (×2): 25 mg via ORAL
  Filled 2022-10-14 (×2): qty 1

## 2022-10-14 MED ORDER — ARIPIPRAZOLE 2 MG PO TABS
2.0000 mg | ORAL_TABLET | Freq: Every day | ORAL | Status: DC
  Administered 2022-10-14: 2 mg via ORAL
  Filled 2022-10-14: qty 1

## 2022-10-14 MED ORDER — ALBUTEROL SULFATE HFA 108 (90 BASE) MCG/ACT IN AERS: 2.0000 | INHALATION_SPRAY | Freq: Four times a day (QID) | RESPIRATORY_TRACT | Status: DC | PRN

## 2022-10-14 MED ORDER — POTASSIUM CHLORIDE CRYS ER 20 MEQ PO TBCR
20.0000 meq | EXTENDED_RELEASE_TABLET | Freq: Two times a day (BID) | ORAL | Status: DC
  Administered 2022-10-14 – 2022-10-15 (×2): 20 meq via ORAL
  Filled 2022-10-14 (×2): qty 1

## 2022-10-14 MED ORDER — MAGNESIUM HYDROXIDE 400 MG/5ML PO SUSP: 30.0000 mL | Freq: Every day | ORAL | Status: DC | PRN

## 2022-10-14 MED ORDER — ACETAMINOPHEN 325 MG PO TABS: 650.0000 mg | ORAL_TABLET | Freq: Four times a day (QID) | ORAL | Status: DC | PRN

## 2022-10-14 MED ORDER — ALUM & MAG HYDROXIDE-SIMETH 200-200-20 MG/5ML PO SUSP: 30.0000 mL | ORAL | Status: DC | PRN

## 2022-10-14 MED ORDER — POTASSIUM CHLORIDE CRYS ER 20 MEQ PO TBCR
20.0000 meq | EXTENDED_RELEASE_TABLET | Freq: Once | ORAL | Status: AC
  Administered 2022-10-14: 20 meq via ORAL
  Filled 2022-10-14: qty 1

## 2022-10-14 MED ORDER — FLUTICASONE PROPIONATE HFA 44 MCG/ACT IN AERO
2.0000 | INHALATION_SPRAY | Freq: Every day | RESPIRATORY_TRACT | Status: DC
  Administered 2022-10-14 – 2022-10-15 (×2): 2 via RESPIRATORY_TRACT
  Filled 2022-10-14: qty 10.6

## 2022-10-14 MED ORDER — TRAZODONE HCL 50 MG PO TABS
50.0000 mg | ORAL_TABLET | Freq: Every evening | ORAL | Status: DC | PRN
  Administered 2022-10-14: 50 mg via ORAL
  Filled 2022-10-14: qty 1
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
## 2022-10-14 NOTE — ED Notes (Signed)
Patient resting quietly in bed with eyes open, Respirations equal and unlabored, skin warm and dry, NAD. No change in assessment or acuity. Routine safety checks conducted according to facility protocol. Will continue to monitor for safety.   

## 2022-10-14 NOTE — ED Notes (Signed)
Pt sleeping@this time. Breathing even and unlabored. Will continue to monitor for safety 
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
## 2022-10-14 NOTE — Progress Notes (Signed)
Established Patient Office Visit  Subjective   Patient ID: Chad Berding., male    DOB: 24-Jun-1995  Age: 27 y.o. MRN: [id number]  Chief Complaint  Patient presents with   Anxiety and Depression    Follow up, no concerns    HPI  Chad Bogus. is here to follow-up on depression and anxiety.  He states that it is getting worse.  He did take the Zoloft which she states helped at first, however he did not get it refilled.  He states that he has been having crying spells and is having a hard time dealing with his ex girlfriend's pregnancy.  He states that she wants to do things her way or she will not let him be in the child's life.  He was told at one point when he was younger that he could not have children so he is wondering if this is even his child.  He has been going to therapy which he has found helpful.  He states that he was going to move back in with his dad and be around his grandma this afternoon in [facility].  He states that he wants to be there for his ex-girlfriend but he is having a really hard time himself.  He has lost a lot of weight and is not really eating.  He has had thoughts of wanting to harm and kill himself.  He states that he has thought about getting in the car and driving off a bridge.  He also states that he was laying in the backyard and got his knife out and was going to cut his wrists, but then his brother walked in and he did not end up doing it.  He states that he is really tired and just having a hard time.     10/14/2022    1:16 PM 09/13/2022    9:35 AM 03/11/2022   10:09 AM 11/26/2021    3:20 PM  Depression screen PHQ 2/9  Decreased Interest 2 1 1 1   Down, Depressed, Hopeless 1 2 0 1  PHQ - 2 Score 3 3 1 2   Altered sleeping 1 1 1 1   Tired, decreased energy 2 2 1 2   Change in appetite 2 1 1 2   Feeling bad or failure about yourself  3 2 0 1  Trouble concentrating 2 1 1  0  Moving slowly or fidgety/restless 2 1 1  0   Suicidal thoughts 2 1 0 0  PHQ-9 Score 17 12 6 8   Difficult doing work/chores Somewhat difficult Somewhat difficult Not difficult at all Somewhat difficult      10/14/2022    1:17 PM 09/13/2022    9:35 AM 03/11/2022   10:10 AM 11/26/2021    3:20 PM  GAD 7 : Generalized Anxiety Score  Nervous, Anxious, on Edge 2 1 1 1   Control/stop worrying 2 3 0 1  Worry too much - different things 3 2 1 1   Trouble relaxing 3 1 0 1  Restless 1 1 1 1   Easily annoyed or irritable 3 1 0 1  Afraid - awful might happen 3 1 0 0  Total GAD 7 Score 17 10 3 6   Anxiety Difficulty Somewhat difficult Somewhat difficult Not difficult at all Not difficult at all      ROS See pertinent positives and negatives per HPI.    Objective:     BP 104/76 (BP Location: Right Arm)   Pulse 65  Temp (!) 97 F (36.1 C)   Ht 6' (1.829 m)   Wt 195 lb (88.5 kg)   SpO2 99%   BMI 26.45 kg/m  BP Readings from Last 3 Encounters:  10/14/22 104/76  09/13/22 110/80  09/01/22 118/77   Wt Readings from Last 3 Encounters:  10/14/22 195 lb (88.5 kg)  09/13/22 218 lb (98.9 kg)  09/01/22 231 lb (104.8 kg)      Physical Exam Vitals and nursing note reviewed.  Constitutional:      Appearance: Normal appearance.  HENT:     Head: Normocephalic.  Eyes:     Conjunctiva/sclera: Conjunctivae normal.  Pulmonary:     Effort: Pulmonary effort is normal.  Musculoskeletal:     Cervical back: Normal range of motion.  Skin:    General: Skin is warm.  Neurological:     General: No focal deficit present.     Mental Status: He is alert and oriented to person, place, and time.  Psychiatric:        Mood and Affect: Mood normal. Affect is tearful.        Behavior: Behavior normal.        Thought Content: Thought content includes suicidal ideation. Thought content includes suicidal plan.        Judgment: Judgment normal.      Assessment & Plan:   Problem List Items Addressed This Visit       Other   Anxiety and  depression - Primary    Chronic, not controlled.  His depression and anxiety have significantly worsened over the past few weeks.  He states the Zoloft did help at first but he did not get this refilled.  He is very tearful on exam and has stated over and over that he is just tired.  He called his dad and his grandma on the phone during her visit were both concerned about him.  He has lost 35 pounds over the past month.  He states that he is not eating.  He does have thoughts of suicide and was about to attempt cutting his wrist until his brother walked in.  With concerns of patient's safety did discuss that I recommend immediate evaluation with psychiatry.  He is in agreement with this.Marland Kitchen  He is in agreement with this.  911 was called and the rapid behavioral health assessment team came and evaluated him.  They took him over to the behavioral health urgent care for further evaluation.      Other Visit Diagnoses     Fertility testing       he would like fertility testing since he was told in the past he may be infertile. Referral placed to urology.   Relevant Orders   Ambulatory referral to Urology       No follow-ups on file.    Gerre Scull, NP

## 2022-10-14 NOTE — Progress Notes (Signed)
This CSW requested that [facility] review for inpatient behavioral health placement. Pt meets inpatient behavioral health criteria per Erskine Emery, NP.   Care Team notified: [facility] Rosey Bath, RN, [facility] Despina Arias, Dispositions CSW [place], [place]    Maryjean Ka, MSW, [facility] 10/14/2022 11:31 PM

## 2022-10-14 NOTE — Progress Notes (Signed)
   10/14/22 1249  BHUC Triage Screening (Walk-ins at [facility] only)  What Is the Reason for Your Visit/Call Today? Pt is a 27 yo male who presented to [facility] voluntarily with BHRT reporting SI with a plan to slit wrist or drive car off a bridge. Pt denies HI, and substance use. Pt reports history of AVH and tactile hallucinations.  How Long Has This Been Causing You Problems? 1-6 months  Have You Recently Had Any Thoughts About Hurting Yourself? Yes  How long ago did you have thoughts about hurting yourself? for a while  Are You Planning to Commit Suicide/Harm Yourself At This time? Yes  Have you Recently Had Thoughts About Hurting Someone Karolee Ohs? Yes  How long ago did you have thoughts of harming others? reports haivng thoughts of wanting to harm his baby mother during a disagrement they had recently. Pt denies plans or intentions of harming her.  Are You Planning To Harm Someone At This Time? No  Are you currently experiencing any auditory, visual or other hallucinations? No (reports histoyr of AVH and tactile hallucinations)  Have You Used Any Alcohol or Drugs in the Past 24 Hours? No  Do you have any current medical co-morbidities that require immediate attention? No  Clinician description of patient physical appearance/behavior: calm, depressed, stressed  What Do You Feel Would Help You the Most Today? Treatment for Depression or other mood problem  If access to [facility] Urgent Care was not available, would you have sought care in the Emergency Department? No  Determination of Need Emergent (2 hours)  Options For Referral Medication Management;Outpatient Therapy;Inpatient Hospitalization

## 2022-10-14 NOTE — Assessment & Plan Note (Signed)
Chronic, not controlled.  His depression and anxiety have significantly worsened over the past few weeks.  He states the Zoloft did help at first but he did not get this refilled.  He is very tearful on exam and has stated over and over that he is just tired.  He called his dad and his grandma on the phone during her visit were both concerned about him.  He has lost 35 pounds over the past month.  He states that he is not eating.  He does have thoughts of suicide and was about to attempt cutting his wrist until his brother walked in.  With concerns of patient's safety did discuss that I recommend immediate evaluation with psychiatry.  He is in agreement with this.Chad Willis  He is in agreement with this.  911 was called and the rapid behavioral health assessment team came and evaluated him.  They took him over to the behavioral health urgent care for further evaluation.

## 2022-10-14 NOTE — Progress Notes (Unsigned)
                Tyon Cerasoli L Almando Brawley, LMFT 

## 2022-10-14 NOTE — ED Notes (Signed)
Patient has been admitted to the observation unit. Patient denies SI/HI and AVH. Patient reported he has had some problems with his girlfriend and recently lost his mother. Patient is willing to go to [facility] for inpatient treatment.

## 2022-10-15 ENCOUNTER — Encounter (HOSPITAL_COMMUNITY): Payer: Self-pay | Admitting: Psychiatry

## 2022-10-15 ENCOUNTER — Other Ambulatory Visit: Payer: Self-pay

## 2022-10-15 ENCOUNTER — Telehealth: Payer: Self-pay | Admitting: Nurse Practitioner

## 2022-10-15 ENCOUNTER — Inpatient Hospital Stay (HOSPITAL_COMMUNITY)
Admission: AD | Admit: 2022-10-15 | Discharge: 2022-10-18 | DRG: 885 | Disposition: A | Discharge status: Home / Self Care | Payer: Medicaid Other | Source: Intra-hospital | Attending: Psychiatry | Admitting: Psychiatry

## 2022-10-15 DIAGNOSIS — Z8249 Family history of ischemic heart disease and other diseases of the circulatory system: Secondary | ICD-10-CM

## 2022-10-15 DIAGNOSIS — J45909 Unspecified asthma, uncomplicated: Secondary | ICD-10-CM | POA: Diagnosis present

## 2022-10-15 DIAGNOSIS — Z820 Family history of epilepsy and other diseases of the nervous system: Secondary | ICD-10-CM

## 2022-10-15 DIAGNOSIS — Z833 Family history of diabetes mellitus: Secondary | ICD-10-CM

## 2022-10-15 DIAGNOSIS — Z79899 Other long term (current) drug therapy: Secondary | ICD-10-CM

## 2022-10-15 DIAGNOSIS — R45851 Suicidal ideations: Secondary | ICD-10-CM

## 2022-10-15 DIAGNOSIS — F332 Major depressive disorder, recurrent severe without psychotic features: Secondary | ICD-10-CM | POA: Diagnosis present

## 2022-10-15 DIAGNOSIS — Z880 Allergy status to penicillin: Secondary | ICD-10-CM | POA: Diagnosis not present

## 2022-10-15 LAB — COMPREHENSIVE METABOLIC PANEL
ALT: 22 U/L (ref 0–44)
AST: 17 U/L (ref 15–41)
Albumin: 4.2 g/dL (ref 3.5–5.0)
Alkaline Phosphatase: 54 U/L (ref 38–126)
Anion gap: 8 (ref 5–15)
BUN: 8 mg/dL (ref 6–20)
CO2: 29 mmol/L (ref 22–32)
Calcium: 9.6 mg/dL (ref 8.9–10.3)
Chloride: 106 mmol/L (ref 98–111)
Creatinine, Ser: 1.09 mg/dL (ref 0.61–1.24)
GFR, Estimated: >60 mL/min (ref >60)
Glucose, Bld: 102 mg/dL — ABNORMAL HIGH (ref 70–99)
Potassium: 3.8 mmol/L (ref 3.5–5.1)
Sodium: 143 mmol/L (ref 135–145)
Total Bilirubin: 2.3 mg/dL — ABNORMAL HIGH (ref 0.3–1.2)
Total Protein: 7.2 g/dL (ref 6.5–8.1)

## 2022-10-15 LAB — PROLACTIN: Prolactin: 12.1 ng/mL (ref 3.6–31.5)

## 2022-10-15 MED ORDER — SERTRALINE HCL 50 MG PO TABS: 50.0000 mg | ORAL_TABLET | Freq: Every day | ORAL | Status: DC

## 2022-10-15 MED ORDER — HYDROXYZINE HCL 25 MG PO TABS: 25.0000 mg | ORAL_TABLET | Freq: Three times a day (TID) | ORAL | Status: DC | PRN

## 2022-10-15 MED ORDER — LORAZEPAM 2 MG/ML IJ SOLN: 2.0000 mg | Freq: Three times a day (TID) | INTRAMUSCULAR | Status: DC | PRN

## 2022-10-15 MED ORDER — ALBUTEROL SULFATE HFA 108 (90 BASE) MCG/ACT IN AERS: 2.0000 | INHALATION_SPRAY | Freq: Four times a day (QID) | RESPIRATORY_TRACT | Status: DC | PRN

## 2022-10-15 MED ORDER — SERTRALINE HCL 50 MG PO TABS
50.0000 mg | ORAL_TABLET | Freq: Every day | ORAL | Status: DC
  Administered 2022-10-16: 50 mg via ORAL
  Filled 2022-10-15 (×3): qty 1

## 2022-10-15 MED ORDER — BECLOMETHASONE DIPROP HFA 40 MCG/ACT IN AERB: 2.0000 | INHALATION_SPRAY | Freq: Two times a day (BID) | RESPIRATORY_TRACT | Status: DC

## 2022-10-15 MED ORDER — LORAZEPAM 1 MG PO TABS: 2.0000 mg | ORAL_TABLET | Freq: Three times a day (TID) | ORAL | Status: DC | PRN

## 2022-10-15 MED ORDER — FLUTICASONE PROPIONATE HFA 44 MCG/ACT IN AERO
2.0000 | INHALATION_SPRAY | Freq: Two times a day (BID) | RESPIRATORY_TRACT | Status: DC
  Administered 2022-10-15 – 2022-10-18 (×6): 2 via RESPIRATORY_TRACT
  Filled 2022-10-15 (×2): qty 10.6

## 2022-10-15 MED ORDER — HALOPERIDOL 5 MG PO TABS: 5.0000 mg | ORAL_TABLET | Freq: Three times a day (TID) | ORAL | Status: DC | PRN

## 2022-10-15 MED ORDER — DIPHENHYDRAMINE HCL 50 MG/ML IJ SOLN: 50.0000 mg | Freq: Three times a day (TID) | INTRAMUSCULAR | Status: DC | PRN

## 2022-10-15 MED ORDER — ALUM & MAG HYDROXIDE-SIMETH 200-200-20 MG/5ML PO SUSP: 30.0000 mL | ORAL | Status: DC | PRN

## 2022-10-15 MED ORDER — HALOPERIDOL LACTATE 5 MG/ML IJ SOLN: 5.0000 mg | Freq: Three times a day (TID) | INTRAMUSCULAR | Status: DC | PRN

## 2022-10-15 MED ORDER — TRAZODONE HCL 50 MG PO TABS: 50.0000 mg | ORAL_TABLET | Freq: Every evening | ORAL | Status: DC | PRN

## 2022-10-15 MED ORDER — ACETAMINOPHEN 325 MG PO TABS: 650.0000 mg | ORAL_TABLET | Freq: Four times a day (QID) | ORAL | Status: DC | PRN

## 2022-10-15 MED ORDER — ARIPIPRAZOLE 2 MG PO TABS
2.0000 mg | ORAL_TABLET | Freq: Every day | ORAL | Status: DC
  Administered 2022-10-15: 2 mg via ORAL
  Filled 2022-10-15 (×4): qty 1

## 2022-10-15 MED ORDER — DIPHENHYDRAMINE HCL 25 MG PO CAPS: 50.0000 mg | ORAL_CAPSULE | Freq: Three times a day (TID) | ORAL | Status: DC | PRN

## 2022-10-15 MED ORDER — MAGNESIUM HYDROXIDE 400 MG/5ML PO SUSP: 30.0000 mL | Freq: Every day | ORAL | Status: DC | PRN

## 2022-10-15 MED ORDER — ARIPIPRAZOLE 2 MG PO TABS: 2.0000 mg | ORAL_TABLET | Freq: Every day | ORAL | Status: DC

## 2022-10-15 NOTE — ED Notes (Signed)
Pt sleeping@this time. Breathing even and unlabored. Will continue to monitor for safety 

## 2022-10-15 NOTE — ED Provider Notes (Signed)
FBC/OBS ASAP Discharge Summary  Date and Time: 10/15/2022 1:24 PM  Name: Chad Willis.  MRN:  [id number]   Discharge Diagnoses:  Final diagnoses:  Suicidal ideation  MDD (major depressive disorder), recurrent severe, without psychosis (HCC)  HPI: Chad Willis. is a 27 y.o. male patient with a past psychiatric history of anxiety, depression, and adjustment disorder who presented to [facility] voluntarily and unaccompanied via [facility] on 10/14/2022 with complaints of suicidal ideations and worsening depression.  He was recommended for inpatient psychiatric admission.  He was admitted to the continuous assessment unit while awaiting inpatient bed availability.   Patient assessed face-to-face by this provider and chart reviewed on 10/15/22.    Subjective:   On today's assessment patient is observed sitting in his bed eating.  He is alert/oriented x 4, cooperative, calm and attentive.  He has normal speech and behavior.  He is casually dressed and makes good eye contact.  He continues to endorse depression and has a depressed affect. He is suicidal with a plan to cut his wrist or drive his car off of a bridge.  He cannot contract for safety.  He initially had endorsed homicidal ideations but reports those thoughts that have improved today.  He is denying any AVH.  He does not appear psychotic, manic, or paranoid.  Stay Summary:   Patient has been cooperative and compliant with staff while on the unit.  He has been appropriate with other patients.  He has required no as needed medications for agitation.  He continues to meet criteria for inpatient psychiatric admission.  [facility] notified and patient has been accepted.  He will be transported via safe transport.  Total Time spent with patient: 30 minutes  Past Psychiatric History: as documented in H&P Past Medical History: as documented in H&P Family History: as documented in H&P Family Psychiatric History: as documented in  H&P Social History: as documented in H&P Tobacco Cessation:  Prescription not provided because: [facility] admisison    Current Medications:  Current Facility-Administered Medications  Medication Dose Route Frequency Provider Last Rate Last Admin   acetaminophen (TYLENOL) tablet 650 mg  650 mg Oral Q6H PRN Sunday Corn, NP       albuterol (VENTOLIN HFA) 108 (90 Base) MCG/ACT inhaler 2 puff  2 puff Inhalation Q6H PRN Sunday Corn, NP       alum & mag hydroxide-simeth (MAALOX/MYLANTA) 200-200-20 MG/5ML suspension 30 mL  30 mL Oral Q4H PRN Sunday Corn, NP       ARIPiprazole (ABILIFY) tablet 2 mg  2 mg Oral QHS Sunday Corn, NP   2 mg at 10/14/22 2127   fluticasone (FLOVENT HFA) 44 MCG/ACT inhaler 2 puff  2 puff Inhalation Daily Sunday Corn, NP   2 puff at 10/15/22 1031   hydrOXYzine (ATARAX) tablet 25 mg  25 mg Oral TID PRN Sunday Corn, NP       magnesium hydroxide (MILK OF MAGNESIA) suspension 30 mL  30 mL Oral Daily PRN Sunday Corn, NP       [START ON 10/16/2022] sertraline (ZOLOFT) tablet 50 mg  50 mg Oral Daily Ardis Hughs, NP       traZODone (DESYREL) tablet 50 mg  50 mg Oral QHS PRN Sunday Corn, NP   50 mg at 10/14/22 2127   Current Outpatient Medications  Medication Sig Dispense Refill   acetaminophen (TYLENOL) 500 MG tablet  Take 1,000 mg by mouth every 6 (six) hours as needed for headache.     albuterol (VENTOLIN HFA) 108 (90 Base) MCG/ACT inhaler Inhale 2 puffs into the lungs every 6 (six) hours as needed for wheezing or shortness of breath. 8 g 3   beclomethasone (QVAR REDIHALER) 40 MCG/ACT inhaler Inhale 2 puffs into the lungs 2 (two) times daily. 1 each 6   sertraline (ZOLOFT) 25 MG tablet Take 1 tablet (25 mg total) by mouth daily. 30 tablet 1    PTA Medications:  Facility Ordered Medications  Medication   acetaminophen (TYLENOL) tablet 650 mg   alum & mag hydroxide-simeth (MAALOX/MYLANTA)  200-200-20 MG/5ML suspension 30 mL   magnesium hydroxide (MILK OF MAGNESIA) suspension 30 mL   hydrOXYzine (ATARAX) tablet 25 mg   traZODone (DESYREL) tablet 50 mg   albuterol (VENTOLIN HFA) 108 (90 Base) MCG/ACT inhaler 2 puff   fluticasone (FLOVENT HFA) 44 MCG/ACT inhaler 2 puff   ARIPiprazole (ABILIFY) tablet 2 mg   [COMPLETED] potassium chloride SA (KLOR-CON M) CR tablet 20 mEq   [START ON 10/16/2022] sertraline (ZOLOFT) tablet 50 mg   PTA Medications  Medication Sig   beclomethasone (QVAR REDIHALER) 40 MCG/ACT inhaler Inhale 2 puffs into the lungs 2 (two) times daily.   sertraline (ZOLOFT) 25 MG tablet Take 1 tablet (25 mg total) by mouth daily.   albuterol (VENTOLIN HFA) 108 (90 Base) MCG/ACT inhaler Inhale 2 puffs into the lungs every 6 (six) hours as needed for wheezing or shortness of breath.       10/14/2022    1:16 PM 09/13/2022    9:35 AM 03/11/2022   10:09 AM  Depression screen PHQ 2/9  Decreased Interest 2 1 1   Down, Depressed, Hopeless 1 2 0  PHQ - 2 Score 3 3 1   Altered sleeping 1 1 1   Tired, decreased energy 2 2 1   Change in appetite 2 1 1   Feeling bad or failure about yourself  3 2 0  Trouble concentrating 2 1 1   Moving slowly or fidgety/restless 2 1 1   Suicidal thoughts 2 1 0  PHQ-9 Score 17 12 6   Difficult doing work/chores Somewhat difficult Somewhat difficult Not difficult at all    Flowsheet Row ED from 10/14/2022 in [facility] ED from 09/01/2022 in [facility] Emergency Department at [facility] ED from 03/18/2022 in [facility] Emergency Department at [facility]  C-SSRS RISK CATEGORY High Risk No Risk No Risk       Musculoskeletal  Strength & Muscle Tone: within normal limits Gait & Station: normal Patient leans: N/A  Psychiatric Specialty Exam  Presentation  General Appearance:  Appropriate for Environment; Casual  Eye Contact: Good  Speech: Clear and Coherent; Normal Rate  Speech  Volume: Normal  Handedness: Right   Mood and Affect  Mood: Depressed  Affect: Congruent   Thought Process  Thought Processes: Coherent  Descriptions of Associations:Intact  Orientation:Full (Time, Place and Person)  Thought Content:Logical  Diagnosis of Schizophrenia or Schizoaffective disorder in past: No    Hallucinations:Hallucinations: None  Ideas of Reference:None  Suicidal Thoughts:Suicidal Thoughts: Yes, Active SI Active Intent and/or Plan: With Intent; With Plan; With Means to Carry Out  Homicidal Thoughts:Homicidal Thoughts: No HI Passive Intent and/or Plan: Without Intent; Without Plan   Sensorium  Memory: Immediate Good; Recent Good; Remote Good  Judgment: Fair  Insight: Fair   Executive Functions  Concentration: Good  Attention Span: Good  Recall: Dudley Major  of Knowledge: Good  Language: Good   Psychomotor Activity  Psychomotor Activity: Psychomotor Activity: Normal   Assets  Assets: Communication Skills; Desire for Improvement; Physical Health; Resilience; Social Support; Housing; Financial Resources/Insurance   Sleep  Sleep: Sleep: Fair Number of Hours of Sleep: 0 (4-6 hours)   Nutritional Assessment (For OBS and FBC admissions only) Has the patient had a weight loss or gain of 10 pounds or more in the last 3 months?: No Has the patient had a decrease in food intake/or appetite?: No Does the patient have dental problems?: No Does the patient have eating habits or behaviors that may be indicators of an eating disorder including binging or inducing vomiting?: No Has the patient recently lost weight without trying?: 0 Has the patient been eating poorly because of a decreased appetite?: 0 Malnutrition Screening Tool Score: 0    Physical Exam  Physical Exam Vitals and nursing note reviewed.  Constitutional:      General: He is not in acute distress.    Appearance: Normal appearance. He is well-developed.  HENT:      Head: Normocephalic.  Eyes:     General:        Right eye: No discharge.        Left eye: No discharge.  Cardiovascular:     Rate and Rhythm: Normal rate.  Pulmonary:     Effort: Pulmonary effort is normal.  Musculoskeletal:        General: Normal range of motion.     Cervical back: Normal range of motion.  Skin:    Capillary Refill: Capillary refill takes less than 2 seconds.     Coloration: Skin is not jaundiced or pale.  Neurological:     Mental Status: He is alert and oriented to person, place, and time.  Psychiatric:        Attention and Perception: Attention and perception normal.        Mood and Affect: Mood is depressed.        Speech: Speech normal.        Behavior: Behavior normal. Behavior is cooperative.        Thought Content: Thought content includes suicidal ideation. Thought content includes suicidal plan.        Cognition and Memory: Cognition normal.        Judgment: Judgment is impulsive.    Review of Systems  Constitutional: Negative.   HENT: Negative.    Eyes: Negative.   Respiratory: Negative.    Cardiovascular: Negative.   Musculoskeletal: Negative.   Skin: Negative.   Neurological: Negative.   Psychiatric/Behavioral:  Positive for depression and suicidal ideas.    Blood pressure (!) 99/51, pulse (!) 56, temperature (!) 97.5 F (36.4 C), temperature source Oral, resp. rate 16, SpO2 100 %. There is no height or weight on file to calculate BMI.    Disposition:  Patient continues to meet criteria for inpatient psychiatric admission.  Patient has been accepted to [facility] for inpatient admission.  Dr. Sherron Flemings is accepting MD  Ardis Hughs, NP 10/15/2022, 1:24 PM

## 2022-10-15 NOTE — ED Notes (Signed)
Patient resting quietly in bed with eyes open, Respirations equal and unlabored, skin warm and dry, NAD. No change in assessment or acuity. Routine safety checks conducted according to facility protocol. Will continue to monitor for safety.   

## 2022-10-15 NOTE — Progress Notes (Signed)
Pt was accepted to [facility] 10/15/2022; Bed Assignment 402-1.  Pt meets inpatient criteria per Blanca Friend  Attending Physician will be Dr. Phineas Inches, MD  Report can be called to:  -Adult unit: [phone]  Pt can arrive after: Day [facility] will coordinate with the care team.  Care Team notified: [facility] Rosey Bath, RN, [facility] [place], Bethany Hendra,LCSWA   [place], [place] 10/15/2022 @ 12:15 AM

## 2022-10-15 NOTE — Progress Notes (Signed)
Pt admitted to [facility] under voluntary status from [facility] where he presented for SI with plan to drive car off bridge. He's A & O X4 with fair eye contact, logical speech, fair eye contact and is cooperative with care. Denies SI, HI, AVH and pain at this time. Per pt "I told my primary care doctor that I was tired of being stressed and worry about everything. I lost my mom last March, still grieving. I was in [employer] at [employer] for my Masters in Recreation therapy and now I got a girl pregnant and we are arguing all the time" when asked about events leading to admission. 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 Reports history of increased paranoia, visual and tactile hallucinations at a younger "but not anymore".Ambulatory to unit with steady gait. Unit orientation done, routines discussed, care plan reviewed and admission documents signed. Emotional support, encouragement and reassurance offered. Safety checks initiated at Q 15 minutes intervals without incident. Pt tolerated fluids and meals well. Off unit to courtyard with peers, returned without issues. Safety maintained in milieu. Supplies given for hygiene. Pt denies concerns at this time.

## 2022-10-15 NOTE — Group Note (Signed)
Date:  10/15/2022 Time:  5:00 PM  Group Topic/Focus:  Goals Group:   The focus of this group is to help patients establish daily goals to achieve during treatment and discuss how the patient can incorporate goal setting into their daily lives to aide in recovery. Orientation:   The focus of this group is to educate the patient on the purpose and policies of crisis stabilization and provide a format to answer questions about their admission.  The group details unit policies and expectations of patients while admitted.    Participation Level:  Did Not Attend  Participation Quality:   n/a  Affect:   n/a  Cognitive:   n/a  Insight: None  Engagement in Group:   n/a  Modes of Intervention:   n/a  Additional Comments:   Pt did not attend.  Edmund Hilda Jalal Rauch 10/15/2022, 5:00 PM

## 2022-10-15 NOTE — Discharge Instructions (Signed)
Transfer patient to [facility] for inpatient psychiatric admission.  Dr. Sherron Flemings is the accepting physician

## 2022-10-15 NOTE — [facility] Group Notes (Signed)
[facility] Group Notes:  (Nursing/MHT/Case Management/Adjunct)  Date:  10/15/2022  Time:  9:45 PM  Type of Therapy:   Wrap-up group  Participation Level:  Active  Participation Quality:  Appropriate  Affect:  Appropriate  Cognitive:  Appropriate  Insight:  Appropriate  Engagement in Group:  Engaged  Modes of Intervention:  Education  Summary of Progress/Problems: Pt reports today is his first day, Pt wants to learn the program. Day 7/10.  Noah Delaine 10/15/2022, 9:45 PM

## 2022-10-15 NOTE — ED Notes (Signed)
Patient sitting quietly on the unit, eating lunch, no distress noted will continue to monitor patient for safety.

## 2022-10-15 NOTE — ED Notes (Signed)
Patient discharged with all belongings, being transported to [facility] by safe transport. Contracts for safety.

## 2022-10-15 NOTE — Tx Team (Signed)
Initial Treatment Plan 10/15/2022 3:45 PM Chad Willis. ZOX:[id number]    PATIENT STRESSORS: Educational concerns   Traumatic event     PATIENT STRENGTHS: Ability for insight  Capable of independent living  Personnel officer means  Religious Affiliation  Supportive family/friends  Work skills    PATIENT IDENTIFIED PROBLEMS: Alterations in mood (Depressed) "I've been feeling depressed, going through lots of stuff lately"    Risk for self harm "I told my PCP I was not doing well and I didn't feel like living anymore. Was thinking of driving my car off the bridge"    Educational Concerns "I'm trying to do my Masters in Recreation Therapy but I had to take a break".    Relationship Concern /Strain "I have someone pregnant and we are just not getting along right now".    Grieving "My mom passed last March and I      DISCHARGE CRITERIA:  Improved stabilization in mood, thinking, and/or behavior Verbal commitment to aftercare and medication compliance  PRELIMINARY DISCHARGE PLAN: Outpatient therapy Return to previous living arrangement Return to previous work or school arrangements  PATIENT/FAMILY INVOLVEMENT: This treatment plan has been presented to and reviewed with the patient, [name]. The patient have been given the opportunity to ask questions and make suggestions.  Sherryl Manges, RN 10/15/2022, 3:45 PM

## 2022-10-15 NOTE — ED Notes (Signed)
Pt is currently sleeping, no distress noted, environmental check complete, will continue to monitor patient for safety.  

## 2022-10-16 ENCOUNTER — Telehealth: Payer: Self-pay

## 2022-10-16 ENCOUNTER — Encounter (HOSPITAL_COMMUNITY): Payer: Self-pay

## 2022-10-16 DIAGNOSIS — F332 Major depressive disorder, recurrent severe without psychotic features: Secondary | ICD-10-CM

## 2022-10-16 MED ORDER — SERTRALINE HCL 25 MG PO TABS
75.0000 mg | ORAL_TABLET | Freq: Every day | ORAL | Status: AC
  Administered 2022-10-17: 75 mg via ORAL
  Filled 2022-10-16: qty 3

## 2022-10-16 MED ORDER — SERTRALINE HCL 100 MG PO TABS
100.0000 mg | ORAL_TABLET | Freq: Every day | ORAL | Status: DC
  Administered 2022-10-18: 100 mg via ORAL
  Filled 2022-10-16 (×2): qty 1

## 2022-10-16 NOTE — H&P (Signed)
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 criteria for MDD. Labs on admission not concerning for underlying medical etiology of his depression/MDD. Overall, states that he is feeling improved since being at [facility] on current regimen after Zoloft was increased to 50mg . Will continue to monitor for side effects or need for medication adjustment.  -- Dc abilify that was started by admitting provider (no need to augment a low dose of zoloft that was only started 3 weeks ago).  -- Continue Zoloft 50mg  once daily for mdd  - continue trazodone PRN and hydroxyzine PRN   -- Continue involvement in groups and individual activities   3. Medical Management CMP: wnl  CBC: unremarkable EtOH: <10 UDS: no substances  detected  TSH: wnl  A1C:  5.5 Lipids: wnl  Prolactin: wnl  Mg: wnl   Physician Treatment Plan for Primary Diagnosis: MDD (major depressive disorder), recurrent severe, without psychosis (HCC) Long Term Goal(s): Improvement in symptoms so as ready for discharge  Short Term Goals: Ability to identify changes in lifestyle to reduce recurrence of condition will improve, Ability to verbalize feelings will improve, Ability to disclose and discuss suicidal ideas, Ability to demonstrate self-control will improve, Ability to identify and develop effective coping behaviors will improve, Ability to maintain clinical measurements within normal limits will improve, Compliance with prescribed medications will improve, and Ability to identify triggers associated with substance abuse/mental health issues will improve  Physician Treatment Plan for Secondary Diagnosis: Principal Problem:   MDD (major depressive disorder), recurrent severe, without psychosis (HCC) Active Problems:   Suicidal ideation  Long Term Goal(s): Improvement in symptoms so as ready for discharge  Short Term Goals: Ability to identify changes in lifestyle to reduce recurrence of condition will improve, Ability to verbalize feelings will improve, Ability to disclose and discuss suicidal ideas, Ability to demonstrate self-control will improve, Ability to identify and develop effective coping behaviors will improve, Ability to maintain clinical measurements within normal limits will improve, Compliance with prescribed medications will improve, and Ability to identify triggers associated with substance abuse/mental health issues will improve  I certify that inpatient services furnished can reasonably be expected to improve the patient's condition.    Rosario Adie, MS3 [facility]  10/16/22 2:23 PM    Total Time Spent in Direct Patient Care:  I personally spent 60 minutes on the unit in direct patient care. The direct  patient care time included face-to-face time with the patient, reviewing the patient's chart, communicating with other professionals, and coordinating care. Greater than 50% of this time was spent in counseling or coordinating care with the patient regarding goals of hospitalization, psycho-education, and discharge planning needs.  I personally was present and performed or re-performed the history, physical exam and medical decision-making activities of this service and have verified that the service and findings are accurately documented in the student's note, , as addended by me or notated below:  I directly edited the note, as above.   Phineas Inches, MD Psychiatrist

## 2022-10-16 NOTE — Transitions of Care (Post Inpatient/ED Visit) (Signed)
   10/16/2022  Name: [name]. MRN: [id number] DOB: Jun 22, 1995  Today's TOC FU Call Status: Today's TOC FU Call Status:: Successful TOC FU Call Competed TOC FU Call Complete Date: 10/16/22  Transition Care Management Follow-up Telephone Call Date of Discharge: 10/15/22 Discharge Facility: [facility]) Type of Discharge: Inpatient Admission Primary Inpatient Discharge Diagnosis:: Major depressive disorder How have you been since you were released from the hospital?: Same (has been admitted to another facility per his father, facility unknown)  Items Reviewed: Medications obtained,verified, and reconciled?: Yes (Medications Reviewed) Any new allergies since your discharge?: No Dietary orders reviewed?: NA Do you have support at home?: Yes People in Home: parent(s) Name of Support/Comfort Primary Source: Father  Medications Reviewed Today: Medications Reviewed Today     Reviewed by Theda Belfast, [facility] (Pharmacist) on 10/15/22 at 1603  Med List Status: Complete   Medication Order Taking? Sig Documenting Provider Last Dose Status Informant  acetaminophen (TYLENOL) 325 MG tablet [id number]  Take 2 tablets (650 mg total) by mouth every 6 (six) hours as needed for mild pain. Ardis Hughs, NP  Active   albuterol (VENTOLIN HFA) 108 (90 Base) MCG/ACT inhaler [id number]  Inhale 2 puffs into the lungs every 6 (six) hours as needed for wheezing or shortness of breath. Gerre Scull, NP  Active Multiple Informants           Med Note Helane Rima Oct 14, 2022  3:09 PM) Patient stated that he ran out.    Discontinued 10/15/22 1603 (Patient has not taken in last 30 days)   beclomethasone (QVAR REDIHALER) 40 MCG/ACT inhaler [id number]  Inhale 2 puffs into the lungs 2 (two) times daily. Gerre Scull, NP  Active Multiple Informants           Med Note Helane Rima Oct 14, 2022  3:09 PM) Patient stated that he ran out.  sertraline (ZOLOFT) 50  MG tablet [id number]  Take 1 tablet (50 mg total) by mouth daily. Ardis Hughs, NP  Active             Home Care and Equipment/Supplies: Were Home Health Services Ordered?: NA Any new equipment or medical supplies ordered?: NA  Functional Questionnaire: Do you need assistance with bathing/showering or dressing?: No Do you need assistance with meal preparation?: No Do you need assistance with eating?: No Do you have difficulty maintaining continence: No Do you need assistance with getting out of bed/getting out of a chair/moving?: No Do you have difficulty managing or taking your medications?: No  Follow up appointments reviewed: PCP Follow-up appointment confirmed?:  (Asked father to have pt ontact the office to schedule a HOSP FU appt, even if a VV.)    SIGNATURE consistent with

## 2022-10-16 NOTE — Group Note (Signed)
Recreation Therapy Group Note   Group Topic:Health and Wellness  Group Date: 10/16/2022 Start Time: 1400 End Time: 1450 Facilitators: Zyiere Rosemond, Benito Mccreedy, LRT  Activity Description/Intervention: Therapeutic Drumming. Patients with peers and staff were given the opportunity to engage in a leader facilitated [employer] Empowerment Drumming Circle with staff from the [employer], in partnership with [employer].    Affect/Mood: N/A   Participation Level: Did not attend    Clinical Observations/Individualized Feedback: Pt declined invitation to join RT session offered.   Benito Mccreedy Shanika Levings, LRT, CTRS 10/16/2022 3:52 PM

## 2022-10-16 NOTE — Progress Notes (Signed)
   10/16/22 0100  Psych Admission Type (Psych Patients Only)  Admission Status Voluntary  Psychosocial Assessment  Patient Complaints None  Eye Contact Fair  Facial Expression Other (Comment) (WDL)  Affect Appropriate to circumstance  Speech Logical/coherent  Interaction Assertive  Motor Activity Other (Comment) (WDL)  Appearance/Hygiene Unremarkable  Behavior Characteristics Cooperative;Appropriate to situation;Calm  Mood Pleasant  Thought Process  Coherency WDL  Content WDL  Delusions None reported or observed  Perception WDL  Hallucination None reported or observed  Judgment Limited  Confusion None  Danger to Self  Current suicidal ideation? Denies  Agreement Not to Harm Self Yes  Description of Agreement Verbal  Danger to Others  Danger to Others None reported or observed   D: Pt alert and oriented. Pt denies experiencing any SI/HI, or AVH at this time.   A: Scheduled medications administered to pt, per MD orders. Support and encouragement provided. Frequent verbal contact made. Routine safety checks conducted q15 minutes.   R: No adverse drug reactions noted. Pt verbally contracts for safety at this time. Pt complaint with medications and treatment plan. Pt interacts well with others on the unit. Pt remains safe at this time. Will continue to monitor.  Patient requested for his sweat pant and jacket from his locker. Removed the string for patient's safety. checked the pockets and no evidence of contraband. Updated the belonging sheet.

## 2022-10-16 NOTE — Plan of Care (Signed)
  Problem: Education: Goal: Verbalization of understanding the information provided will improve Outcome: Progressing   Problem: Activity: Goal: Interest or engagement in activities will improve Outcome: Progressing Goal: Sleeping patterns will improve Outcome: Progressing   Problem: Coping: Goal: Ability to verbalize frustrations and anger appropriately will improve Outcome: Progressing Goal: Ability to demonstrate self-control will improve Outcome: Progressing   Problem: Health Behavior/Discharge Planning: Goal: Identification of resources available to assist in meeting health care needs will improve Outcome: Progressing Goal: Compliance with treatment plan for underlying cause of condition will improve Outcome: Progressing   Problem: Physical Regulation: Goal: Ability to maintain clinical measurements within normal limits will improve Outcome: Progressing   Problem: Safety: Goal: Periods of time without injury will increase Outcome: Progressing   Problem: Coping: Goal: Coping ability will improve Outcome: Progressing   Problem: Health Behavior/Discharge Planning: Goal: Identification of resources available to assist in meeting health care needs will improve Outcome: Progressing   Problem: Medication: Goal: Compliance with prescribed medication regimen will improve Outcome: Progressing   Problem: Self-Concept: Goal: Ability to disclose and discuss suicidal ideas will improve Outcome: Progressing   Problem: Education: Goal: Utilization of techniques to improve thought processes will improve Outcome: Progressing Goal: Knowledge of the prescribed therapeutic regimen will improve Outcome: Progressing

## 2022-10-16 NOTE — Progress Notes (Signed)
Adult Psychoeducational Group Note  Date:  10/16/2022 Time:  9:47 PM  Group Topic/Focus:  Wrap-Up Group:   The focus of this group is to help patients review their daily goal of treatment and discuss progress on daily workbooks.  Participation Level:  Active  Participation Quality:  Appropriate  Affect:  Appropriate  Cognitive:  Appropriate  Insight: Appropriate  Engagement in Group:  Engaged  Modes of Intervention:  Discussion  Additional Comments:  Pt attended and participated in NA meeting.  Maudy Yonan Katrinka Blazing 10/16/2022, 9:47 PM

## 2022-10-16 NOTE — Progress Notes (Signed)
   10/16/22 0831  Psych Admission Type (Psych Patients Only)  Admission Status Voluntary  Psychosocial Assessment  Patient Complaints None  Eye Contact Fair  Facial Expression Flat  Affect Appropriate to circumstance  Speech Logical/coherent  Interaction Assertive  Motor Activity Other (Comment) (WDL)  Appearance/Hygiene Unremarkable  Behavior Characteristics Cooperative;Appropriate to situation  Mood Pleasant  Thought Process  Coherency WDL  Content WDL  Delusions None reported or observed  Perception WDL  Hallucination None reported or observed  Judgment Limited  Confusion None  Danger to Self  Current suicidal ideation? Denies  Agreement Not to Harm Self Yes  Description of Agreement verbal  Danger to Others  Danger to Others None reported or observed   Upon interaction this morning pt is pleasant and denies any questions or concerns at this time.

## 2022-10-16 NOTE — Progress Notes (Signed)
   10/16/22 1950  Psych Admission Type (Psych Patients Only)  Admission Status Voluntary  Psychosocial Assessment  Patient Complaints None  Eye Contact Fair  Facial Expression Flat  Affect Appropriate to circumstance  Speech Logical/coherent  Interaction Assertive  Motor Activity Slow  Appearance/Hygiene Unremarkable  Behavior Characteristics Cooperative;Appropriate to situation  Mood Pleasant  Thought Process  Coherency WDL  Content WDL  Delusions None reported or observed  Perception WDL  Hallucination None reported or observed  Judgment Limited  Confusion None  Danger to Self  Current suicidal ideation? Denies  Agreement Not to Harm Self Yes  Description of Agreement Verbally contracted for sister.  Danger to Others  Danger to Others None reported or observed

## 2022-10-16 NOTE — [facility] Suicide Risk Assessment (Signed)
[facility] Admission Suicide Risk Assessment   Nursing information obtained from:  Patient Demographic factors:  Adolescent or young adult, Male Current Mental Status:  Suicidal ideation indicated by patient, Self-harm thoughts Loss Factors:  Decrease in vocational status Historical Factors:  Anniversary of important loss Risk Reduction Factors:  Employed, Religious beliefs about death, Sense of responsibility to family, Positive social support  Total Time spent with patient: 30 minutes Principal Problem: MDD (major depressive disorder), recurrent severe, without psychosis (HCC) Diagnosis:  Principal Problem:   MDD (major depressive disorder), recurrent severe, without psychosis (HCC) Active Problems:   Suicidal ideation  Subjective Data: See H&P   Continued Clinical Symptoms:  Alcohol Use Disorder Identification Test Final Score (AUDIT): 0 The "Alcohol Use Disorders Identification Test", Guidelines for Use in Primary Care, Second Edition.  World Science writer [employer] [facility] [employer]). Score between 0-7:  no or low risk or alcohol related problems. Score between 8-15:  moderate risk of alcohol related problems. Score between 16-19:  high risk of alcohol related problems. Score 20 or above:  warrants further diagnostic evaluation for alcohol dependence and treatment.   CLINICAL FACTORS:   Depression:   Anhedonia Hopelessness Recent sense of peace/wellbeing Unstable or Poor Therapeutic Relationship Previous Psychiatric Diagnoses and Treatments    Psychiatric Specialty Exam:  Presentation  General Appearance:  Appropriate for Environment; Casual  Eye Contact: Fair  Speech: Clear and Coherent; Slow  Speech Volume: Normal  Handedness: Right   Mood and Affect  Mood: Depressed  Affect: Congruent; Flat   Thought Process  Thought Processes: Coherent; Linear  Descriptions of Associations:Intact  Orientation:Full (Time, Place and Person)  Thought Content:Logical  History  of Schizophrenia/Schizoaffective disorder:No  Duration of Psychotic Symptoms:No data recorded Hallucinations:Hallucinations: None  Ideas of Reference:None  Suicidal Thoughts:Suicidal Thoughts: Not current. Admitted for having active SI with plans.  SI Active Intent and/or Plan: With Intent  Homicidal Thoughts:Homicidal Thoughts: No HI Passive Intent and/or Plan: -- (none)   Sensorium  Memory: Immediate Good; Recent Good; Remote Good  Judgment: Good  Insight: Good   Executive Functions  Concentration: Good  Attention Span: Good  Recall: Good  Fund of Knowledge: Good  Language: Good   Psychomotor Activity  Psychomotor Activity: Psychomotor Activity: Normal   Assets  Assets: Communication Skills; Desire for Improvement; Housing; Physical Health; Resilience; Social Support; Talents/Skills; Transportation; Vocational/Educational   Sleep  Sleep: Sleep: Poor     Physical Exam: Physical Exam See H&P  ROS See H&P  Blood pressure 101/65, pulse 88, temperature 98.3 F (36.8 C), temperature source Oral, resp. rate 20, height 6' (1.829 m), weight 89.2 kg, SpO2 98 %. Body mass index is 26.66 kg/m.   COGNITIVE FEATURES THAT CONTRIBUTE TO RISK:  None    SUICIDE RISK:   Moderate:  Frequent suicidal ideation with limited intensity, and duration, some specificity in terms of plans, no associated intent, good self-control, limited dysphoria/symptomatology, some risk factors present, and identifiable protective factors, including available and accessible social support.  PLAN OF CARE: See H&P   I certify that inpatient services furnished can reasonably be expected to improve the patient's condition.   Cristy Hilts, MD 10/16/2022, 3:27 PM

## 2022-10-16 NOTE — Group Note (Signed)
Date:  10/16/2022 Time:  6:10 PM  Group Topic/Focus:  Goals Group:   The focus of this group is to help patients establish daily goals to achieve during treatment and discuss how the patient can incorporate goal setting into their daily lives to aide in recovery. Orientation:   The focus of this group is to educate the patient on the purpose and policies of crisis stabilization and provide a format to answer questions about their admission.  The group details unit policies and expectations of patients while admitted.    Participation Level:  Active  Participation Quality:  Appropriate  Affect:  Appropriate  Cognitive:  Appropriate  Insight: Appropriate  Engagement in Group:  Engaged  Modes of Intervention:  Discussion, Orientation, and Rapport Building  Additional Comments:   Pt attended and participated in the Orientation/Goals group. Pt personal goal is to "do better with his mental health, get acclimated with treatment and improve his appetite.  Edmund Hilda Aamori Mcmasters 10/16/2022, 6:10 PM

## 2022-10-16 NOTE — BH IP Treatment Plan (Signed)
Interdisciplinary Treatment and Diagnostic Plan Update  10/16/2022 Time of Session: 11:23 AM  Chad Willis. MRN: [id number]  Principal Diagnosis: MDD (major depressive disorder), recurrent severe, without psychosis (HCC)  Secondary Diagnoses: Principal Problem:   MDD (major depressive disorder), recurrent severe, without psychosis (HCC) Active Problems:   Suicidal ideation   Current Medications:  Current Facility-Administered Medications  Medication Dose Route Frequency Provider Last Rate Last Admin   acetaminophen (TYLENOL) tablet 650 mg  650 mg Oral Q6H PRN Ardis Hughs, NP       albuterol (VENTOLIN HFA) 108 (90 Base) MCG/ACT inhaler 2 puff  2 puff Inhalation Q6H PRN Ardis Hughs, NP       alum & mag hydroxide-simeth (MAALOX/MYLANTA) 200-200-20 MG/5ML suspension 30 mL  30 mL Oral Q4H PRN Ardis Hughs, NP       ARIPiprazole (ABILIFY) tablet 2 mg  2 mg Oral QHS Ardis Hughs, NP   2 mg at 10/15/22 2205   diphenhydrAMINE (BENADRYL) capsule 50 mg  50 mg Oral TID PRN Ardis Hughs, NP       Or   diphenhydrAMINE (BENADRYL) injection 50 mg  50 mg Intramuscular TID PRN Ardis Hughs, NP       fluticasone (FLOVENT HFA) 44 MCG/ACT inhaler 2 puff  2 puff Inhalation BID Pricilla Riffle, RPH   2 puff at 10/16/22 0831   haloperidol (HALDOL) tablet 5 mg  5 mg Oral TID PRN Ardis Hughs, NP       Or   haloperidol lactate (HALDOL) injection 5 mg  5 mg Intramuscular TID PRN Ardis Hughs, NP       hydrOXYzine (ATARAX) tablet 25 mg  25 mg Oral TID PRN Ardis Hughs, NP       LORazepam (ATIVAN) tablet 2 mg  2 mg Oral TID PRN Ardis Hughs, NP       Or   LORazepam (ATIVAN) injection 2 mg  2 mg Intramuscular TID PRN Ardis Hughs, NP       magnesium hydroxide (MILK OF MAGNESIA) suspension 30 mL  30 mL Oral Daily PRN Ardis Hughs, NP       sertraline (ZOLOFT) tablet 50 mg  50 mg Oral Daily Ardis Hughs, NP   50 mg at  10/16/22 0831   traZODone (DESYREL) tablet 50 mg  50 mg Oral QHS PRN Ardis Hughs, NP       PTA Medications: Medications Prior to Admission  Medication Sig Dispense Refill Last Dose   acetaminophen (TYLENOL) 325 MG tablet Take 2 tablets (650 mg total) by mouth every 6 (six) hours as needed for mild pain.      albuterol (VENTOLIN HFA) 108 (90 Base) MCG/ACT inhaler Inhale 2 puffs into the lungs every 6 (six) hours as needed for wheezing or shortness of breath. 8 g 3    beclomethasone (QVAR REDIHALER) 40 MCG/ACT inhaler Inhale 2 puffs into the lungs 2 (two) times daily. 1 each 6    sertraline (ZOLOFT) 50 MG tablet Take 1 tablet (50 mg total) by mouth daily.       Patient Stressors: Educational concerns   Traumatic event    Patient Strengths: Ability for insight  Capable of independent living  Personnel officer means  Religious Affiliation  Supportive family/friends  Work skills   Treatment Modalities: Medication Management, Group therapy, Case management,  1 to 1 session with clinician, Psychoeducation, Recreational therapy.   Physician Treatment Plan for  Primary Diagnosis: MDD (major depressive disorder), recurrent severe, without psychosis (HCC) Long Term Goal(s): Improvement in symptoms so as ready for discharge   Short Term Goals: Ability to identify changes in lifestyle to reduce recurrence of condition will improve Ability to verbalize feelings will improve Ability to disclose and discuss suicidal ideas Ability to demonstrate self-control will improve Ability to identify and develop effective coping behaviors will improve Ability to maintain clinical measurements within normal limits will improve Compliance with prescribed medications will improve Ability to identify triggers associated with substance abuse/mental health issues will improve  Medication Management: Evaluate patient's response, side effects, and tolerance of medication  regimen.  Therapeutic Interventions: 1 to 1 sessions, Unit Group sessions and Medication administration.  Evaluation of Outcomes: Not Progressing  Physician Treatment Plan for Secondary Diagnosis: Principal Problem:   MDD (major depressive disorder), recurrent severe, without psychosis (HCC) Active Problems:   Suicidal ideation  Long Term Goal(s): Improvement in symptoms so as ready for discharge   Short Term Goals: Ability to identify changes in lifestyle to reduce recurrence of condition will improve Ability to verbalize feelings will improve Ability to disclose and discuss suicidal ideas Ability to demonstrate self-control will improve Ability to identify and develop effective coping behaviors will improve Ability to maintain clinical measurements within normal limits will improve Compliance with prescribed medications will improve Ability to identify triggers associated with substance abuse/mental health issues will improve     Medication Management: Evaluate patient's response, side effects, and tolerance of medication regimen.  Therapeutic Interventions: 1 to 1 sessions, Unit Group sessions and Medication administration.  Evaluation of Outcomes: Not Progressing   RN Treatment Plan for Primary Diagnosis: MDD (major depressive disorder), recurrent severe, without psychosis (HCC) Long Term Goal(s): Knowledge of disease and therapeutic regimen to maintain health will improve  Short Term Goals: Ability to remain free from injury will improve, Ability to verbalize frustration and anger appropriately will improve, Ability to demonstrate self-control, Ability to participate in decision making will improve, Ability to verbalize feelings will improve, Ability to disclose and discuss suicidal ideas, Ability to identify and develop effective coping behaviors will improve, and Compliance with prescribed medications will improve  Medication Management: RN will administer medications as  ordered by provider, will assess and evaluate patient's response and provide education to patient for prescribed medication. RN will report any adverse and/or side effects to prescribing provider.  Therapeutic Interventions: 1 on 1 counseling sessions, Psychoeducation, Medication administration, Evaluate responses to treatment, Monitor vital signs and CBGs as ordered, Perform/monitor CIWA, COWS, AIMS and Fall Risk screenings as ordered, Perform wound care treatments as ordered.  Evaluation of Outcomes: Not Progressing   LCSW Treatment Plan for Primary Diagnosis: MDD (major depressive disorder), recurrent severe, without psychosis (HCC) Long Term Goal(s): Safe transition to appropriate next level of care at discharge, Engage patient in therapeutic group addressing interpersonal concerns.  Short Term Goals: Engage patient in aftercare planning with referrals and resources, Increase social support, Increase ability to appropriately verbalize feelings, Increase emotional regulation, Facilitate acceptance of mental health diagnosis and concerns, Facilitate patient progression through stages of change regarding substance use diagnoses and concerns, Identify triggers associated with mental health/substance abuse issues, and Increase skills for wellness and recovery  Therapeutic Interventions: Assess for all discharge needs, 1 to 1 time with Social worker, Explore available resources and support systems, Assess for adequacy in community support network, Educate family and significant other(s) on suicide prevention, Complete Psychosocial Assessment, Interpersonal group therapy.  Evaluation of Outcomes: Not Progressing  Progress in Treatment: Attending groups: No. Participating in groups: No. Taking medication as prescribed: Yes. Toleration medication: Yes. Family/Significant other contact made: No, will contact:  Whoever patient gives CSW permission to contact  Patient understands diagnosis:  Yes. Discussing patient identified problems/goals with staff: Yes. Medical problems stabilized or resolved: Yes. Denies suicidal/homicidal ideation: Yes. Issues/concerns per patient self-inventory: No.   New problem(s) identified: No, Describe:  None reported   New Short Term/Long Term Goal(s):medication stabilization, elimination of SI thoughts, development of comprehensive mental wellness plan.    Patient Goals:  " get my mind right/stable and my life on track   Discharge Plan or Barriers: Patient recently admitted. CSW will continue to follow and assess for appropriate referrals and possible discharge planning.    Reason for Continuation of Hospitalization: Anxiety Depression Medication stabilization Suicidal ideation  Estimated Length of Stay:3-5 days  Last 3 [place] Suicide Severity Risk Score: Flowsheet Row [facility] (Current[facility] from [facility] in [facility] from 09/01/2022 in [facility] Emergency Department at [facility]  C-SSRS RISK CATEGORY High Risk High Risk No Risk       Last PHQ 2/9 Scores:    10/14/2022    1:16 PM 09/13/2022    9:35 AM 03/11/2022   10:09 AM  Depression screen PHQ 2/9  Decreased Interest 2 1 1   Down, Depressed, Hopeless 1 2 0  PHQ - 2 Score 3 3 1   Altered sleeping 1 1 1   Tired, decreased energy 2 2 1   Change in appetite 2 1 1   Feeling bad or failure about yourself  3 2 0  Trouble concentrating 2 1 1   Moving slowly or fidgety/restless 2 1 1   Suicidal thoughts 2 1 0  PHQ-9 Score 17 12 6   Difficult doing work/chores Somewhat difficult Somewhat difficult Not difficult at all    Scribe for Treatment Team: Beather Arbour 10/16/2022 2:55 PM

## 2022-10-17 LAB — BASIC METABOLIC PANEL
Anion gap: 4 — ABNORMAL LOW (ref 5–15)
BUN: 11 mg/dL (ref 6–20)
CO2: 29 mmol/L (ref 22–32)
Calcium: 8.7 mg/dL — ABNORMAL LOW (ref 8.9–10.3)
Chloride: 105 mmol/L (ref 98–111)
Creatinine, Ser: 1.01 mg/dL (ref 0.61–1.24)
GFR, Estimated: >60 mL/min (ref >60)
Glucose, Bld: 97 mg/dL (ref 70–99)
Potassium: 3.5 mmol/L (ref 3.5–5.1)
Sodium: 138 mmol/L (ref 135–145)

## 2022-10-17 NOTE — Progress Notes (Signed)
   10/17/22 1100  Psych Admission Type (Psych Patients Only)  Admission Status Voluntary  Psychosocial Assessment  Patient Complaints None  Eye Contact Fair  Facial Expression Flat  Affect Appropriate to circumstance  Speech Logical/coherent  Interaction Assertive  Motor Activity Other (Comment) (WDL)  Appearance/Hygiene Unremarkable  Behavior Characteristics Cooperative;Appropriate to situation  Mood Pleasant  Thought Process  Coherency WDL  Content WDL  Delusions None reported or observed  Perception WDL  Hallucination None reported or observed  Judgment WDL  Confusion None  Danger to Self  Current suicidal ideation? Denies  Agreement Not to Harm Self Yes  Description of Agreement verbal  Danger to Others  Danger to Others None reported or observed

## 2022-10-17 NOTE — Progress Notes (Signed)
Adult Psychoeducational Group Note  Date:  10/17/2022 Time:  9:34 PM  Group Topic/Focus:  Wrap-Up Group:   The focus of this group is to help patients review their daily goal of treatment and discuss progress on daily workbooks.  Participation Level:  Active  Participation Quality:  Appropriate  Affect:  Appropriate  Cognitive:  Appropriate  Insight: Appropriate  Engagement in Group:  Engaged  Modes of Intervention:  Discussion  Additional Comments:  Pt stated he had a great day.  Pt stated his goal was to build on being happy.  Lucilla Lame 10/17/2022, 9:34 PM
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
## 2022-10-17 NOTE — Progress Notes (Signed)
Patient is a 27 year-old male who was admitted on 05/21 for evaluation of depression with suicidal ideation including plan to slit his wrists or drive his car off of a bridge.   Yesterday, the psychiatry team made following recommendations: -- Continue Zoloft 50mg  with plan to titrate up to 100mg  by Friday (05/23)   Interval History: PRN Medications administered within the last 24 hours: none Per nursing staff: He has remained calm, cooperative, and interactive. Has been active and engaged member of groups.     Per Patient:  On assessment today, the patient reports that he continues to feel improved overall. He has noticed that his mood continues to be better and his outlook has turned more positive. He feels that he will be able to handle the stressors in his life better on discharge and is making plans to work through them in positive, gainful ways such as reflection and open conversation. He has been speaking with his family on the phone throughout the day who have been very supportive and have commented to him that he seems much better than pre-admission. He notes that he has been more sleepy throughout this admission but he was not sleeping well prior to being admitted and has had more time to rest and relax. His appetite has improved and he has been eating well while here.   Patient notes no side effects to current scheduled psychiatric medications, including nausea, vomiting, diarrhea, abdominal pain, dizziness, headache, or any other notable problems.   Patient denies other somatic complaints, including: chest pain, shortness of breath, palpitations, fever, chills.   ASSESSMENT:  Diagnoses / Active Problems: #MDD without psychosis   PLAN: Safety and Monitoring:  -- Voluntary admission to inpatient psychiatric unit for safety, stabilization and treatment  -- Daily contact with patient to assess and evaluate symptoms and progress in treatment  -- Patient's case to be discussed in  multi-disciplinary team meeting  -- Observation Level : q15 minute checks  -- Vital signs:  q12 hours  -- Precautions: suicide, elopement, and assault  2. Psychiatric Diagnoses and Treatment:   Major Depressive Disorder without psychosis  -- Received 75mg  Zoloft today   -- The risks/benefits/side-effects/alternatives to this medication were discussed in detail with the patient and time was given for questions. The patient consents to continuing medication trial including uptitration of Zoloft with goal to reach 100mg .   -- Encouraged patient to participate in unit milieu and in scheduled group therapies   -- Short Term Goals: Ability to identify changes in lifestyle to reduce recurrence of condition will improve, Ability to verbalize feelings will improve, Ability to disclose and discuss suicidal ideas, Ability to demonstrate self-control will improve, Ability to identify and develop effective coping behaviors will improve, Ability to maintain clinical measurements within normal limits will improve, Compliance with prescribed medications will improve, and Ability to identify triggers associated with substance abuse/mental health issues will improve  -- Long Term Goals: Improvement in symptoms so as ready for discharge  3. Management of other medical conditions  Adult-onset asthma -- Continue Flovent BID -- Albuterol inhaler PRN for wheezing    4. Discharge Planning:   -- Social work and case management to assist with discharge planning and identification of hospital follow-up needs prior to discharge  -- Estimated LOS: 1-2 more days  -- Discharge Concerns: Need to establish a safety plan; Medication compliance and effectiveness  -- Discharge Goals: Return home with outpatient referrals for mental health follow-up including medication management/psychotherapy

## 2022-10-17 NOTE — [facility] Suicide Risk Assessment (Signed)
[facility] INPATIENT:  Family/Significant Other Suicide Prevention Education  Suicide Prevention Education:  Education Completed; Vinn Zaner (dad) [phone],  (name of family member/significant other) has been identified by the patient as the family member/significant other with whom the patient will be residing, and identified as the person(s) who will aid the patient in the event of a mental health crisis (suicidal ideations/suicide attempt).  With written consent from the patient, the family member/significant other has been provided the following suicide prevention education, prior to the and/or following the discharge of the patient.  The suicide prevention education provided includes the following: Suicide risk factors Suicide prevention and interventions National Suicide Hotline telephone number [facility] assessment telephone number [facility] Emergency Assistance 911 [facility] and/or Residential Mobile Crisis Unit telephone number  Request made of family/significant other to: Remove weapons (e.g., guns, rifles, knives), all items previously/currently identified as safety concern.   Remove drugs/medications (over-the-counter, prescriptions, illicit drugs), all items previously/currently identified as a safety concern.  The family member/significant other verbalizes understanding of the suicide prevention education information provided.  The family member/significant other agrees to remove the items of safety concern listed above.  Isabella Bowens 10/17/2022, 2:17 PM

## 2022-10-17 NOTE — Progress Notes (Signed)
Chaplain received a consult to provide support to [facility].  Chad Willis shared about the emptiness that he feels and the stressors that have led him to be distant from himself and from his faith. 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  He also found out that he is going to be a father.  He feels joyful about that, especially because he was told he could not have children; however he has had significant conflict with the mother of his baby.  He wants to do right by her and take care of her, but they are not, nor were they in a relationship at the time she conceived. He feels like this hopsitalization has been helpful to him to take a break from all of the stress in order to come back to himself.  He plans to take leave from school until the fall and spend time living with his family this summer.  Chaplain encouraged self-care and affirmed that self-care was not selfish.  [address], Bcc Pager, [phone]

## 2022-10-17 NOTE — Group Note (Signed)
Date:  10/17/2022 Time:  12:33 PM  Group Topic/Focus:  Emotional Education:   The focus of this group is to discuss what feelings/emotions are, and how they are experienced. Self Care:   The focus of this group is to help patients understand the importance of self-care in order to improve or restore emotional, physical, spiritual, interpersonal, and financial health.    Participation Level:  Active  Participation Quality:  Appropriate  Affect:  Appropriate  Cognitive:  Appropriate  Insight: Appropriate  Engagement in Group:  Engaged and Supportive  Modes of Intervention:  Discussion, Exploration, Socialization, and Support  Additional Comments:    Memory Dance Sabrina Keough 10/17/2022, 12:33 PM

## 2022-10-17 NOTE — Group Note (Signed)
Occupational Therapy Group Note  Group Topic: Sleep Hygiene  Group Date: 10/17/2022 Start Time: 1435 End Time: 1505 Facilitators: Mata Rowen G, OT   Group Description: Group encouraged increased participation and engagement through topic focused on sleep hygiene. Patients reflected on the quality of sleep they typically receive and identified areas that need improvement. Group was given background information on sleep and sleep hygiene, including common sleep disorders. Group members also received information on how to improve one's sleep and introduced a sleep diary as a tool that can be utilized to track sleep quality over a length of time. Group session ended with patients identifying one or more strategies they could utilize or implement into their sleep routine in order to improve overall sleep quality.        Therapeutic Goal(s):  Identify one or more strategies to improve overall sleep hygiene  Identify one or more areas of sleep that are negatively impacted (sleep too much, too little, etc)     Participation Level: Engaged   Participation Quality: Independent   Behavior: Appropriate   Speech/Thought Process: Relevant   Affect/Mood: Appropriate   Insight: Fair   Judgement: Fair      Modes of Intervention: Education  Patient Response to Interventions:  Attentive   Plan: Continue to engage patient in OT groups 2 - 3x/week.  10/17/2022  Rheta Hemmelgarn G Glee Lashomb, OT   Kayton Dunaj, OT  

## 2022-10-18 ENCOUNTER — Encounter: Payer: Self-pay | Admitting: Nurse Practitioner

## 2022-10-18 MED ORDER — TRAZODONE HCL 50 MG PO TABS: 50.0000 mg | ORAL_TABLET | Freq: Every evening | ORAL | 0 refills | Status: AC | PRN

## 2022-10-18 MED ORDER — SERTRALINE HCL 100 MG PO TABS: 100.0000 mg | ORAL_TABLET | Freq: Every day | ORAL | 0 refills | Status: DC

## 2022-10-18 MED ORDER — HYDROXYZINE HCL 25 MG PO TABS: 25.0000 mg | ORAL_TABLET | Freq: Three times a day (TID) | ORAL | 0 refills | Status: DC | PRN

## 2022-10-18 MED ORDER — TRAZODONE HCL 50 MG PO TABS: 50.0000 mg | ORAL_TABLET | Freq: Every evening | ORAL | 0 refills | Status: DC | PRN

## 2022-10-18 MED ORDER — SERTRALINE HCL 100 MG PO TABS: 100.0000 mg | ORAL_TABLET | Freq: Every day | ORAL | 0 refills | Status: AC

## 2022-10-18 NOTE — Telephone Encounter (Signed)
error 

## 2022-10-18 NOTE — Progress Notes (Signed)
  [facility] Adult Case Management Discharge Plan :  Will you be returning to the same living situation after discharge:  No. Patient is [place] back to his hometown to live with dad  At discharge, do you have transportation home?: Yes,  Patient grandma will pick him up at 11:30 AM  Do you have the ability to pay for your medications: Yes,  Medicaid Healthy Blue  Release of information consent forms completed and in the chart;  Patient's signature needed at discharge.  Patient to Follow up at:  Follow-up Information     [facility] Outpatient Office - [facility] Follow up on 10/25/2022.   Why: You were have scheduled PHONE appointment with this provider at 12:00pm. This provider will contact you by phone @ [phone] Contact information: Address: [address], [place], [place] [address] Phone [phone] Fax: [phone]                Next level of care provider has access to [facility] Link:no  Safety Planning and Suicide Prevention discussed: Yes,  Dad Ryanmichael Allshouse [phone]     Has patient been referred to the Quitline?: Patient does not use tobacco/nicotine products  Patient has been referred for addiction treatment: No known substance use disorder.  Isabella Bowens, LCSWA 10/18/2022, 10:14 AM

## 2022-10-18 NOTE — Discharge Summary (Signed)
[facility] Discharge Suicide Risk Assessment   Principal Problem: MDD (major depressive disorder), recurrent severe, without psychosis (HCC) Discharge Diagnoses: Principal Problem:   MDD (major depressive disorder), recurrent severe, without psychosis (HCC) Active Problems:   Suicidal ideation      Total Time spent with patient: 30 minutes  Musculoskeletal: Strength & Muscle Tone: within normal limits Gait & Station: normal Patient leans: N/A  Psychiatric Specialty Exam  Presentation  General Appearance:  Appropriate for Environment; Well Groomed  Eye Contact: Good  Speech: Clear and Coherent; Normal Rate  Speech Volume: Normal  Handedness: Right   Mood and Affect  Mood: Euphoric  Duration of Depression Symptoms: Greater than two weeks  Affect: Appropriate   Thought Process  Thought Processes: Coherent; Goal Directed  Descriptions of Associations:Intact  Orientation:Full (Time, Place and Person)  Thought Content:Abstract Reasoning; Logical  History of Schizophrenia/Schizoaffective disorder:No  Duration of Psychotic Symptoms:No data recorded Hallucinations:Hallucinations: None  Ideas of Reference:None  Suicidal Thoughts:Suicidal Thoughts: No  Homicidal Thoughts:Homicidal Thoughts: No   Sensorium  Memory: Immediate Good  Judgment: Fair  Insight: Fair   Executive Functions  Concentration: Good  Attention Span: Good  Recall: Fair  Fund of Knowledge: Fair  Language: Fair   Psychomotor Activity  Psychomotor Activity:Psychomotor Activity: Normal   Assets  Assets: Communication Skills; Desire for Improvement; Housing; Health and safety inspector; Physical Health; Talents/Skills; Social Support; Transportation; Vocational/Educational   Sleep  Sleep:Sleep: Fair   Physical Exam: Physical Exam Vitals and nursing note reviewed.  Constitutional:      Appearance: Normal appearance.  HENT:     Head: Atraumatic.      Nose: No congestion.  Eyes:     Pupils: Pupils are equal, round, and reactive to light.  Pulmonary:     Effort: No respiratory distress.  Abdominal:     General: There is no distension.  Skin:    Coloration: Skin is not jaundiced.  Neurological:     Mental Status: He is alert and oriented to person, place, and time. Mental status is at baseline.     Gait: Gait normal.  Psychiatric:        Mood and Affect: Mood normal.        Behavior: Behavior normal.        Thought Content: Thought content normal.        Judgment: Judgment normal.    Review of Systems  Constitutional:  Negative for fever and weight loss.  HENT:  Negative for hearing loss.   Eyes:  Negative for blurred vision.  Respiratory:  Negative for cough.   Cardiovascular:  Negative for chest pain.  Neurological:  Negative for dizziness, speech change and headaches.  Psychiatric/Behavioral:  Negative for depression, hallucinations, memory loss and suicidal ideas. The patient is not nervous/anxious.    Blood pressure 117/73, pulse 69, temperature 97.7 F (36.5 C), temperature source Oral, resp. rate 18, height 6' (1.829 m), weight 89.2 kg, SpO2 100 %. Body mass index is 26.66 kg/m.  Mental Status Per Nursing Assessment::   On Admission:  Suicidal ideation indicated by patient, Self-harm thoughts  Demographic Factors:  Male and Adolescent or young adult  Loss Factors: NA  Historical Factors: Impulsivity  Risk Reduction Factors:   Living with another person, especially a relative and Positive social support  Continued Clinical Symptoms:  Depression:   Recent sense of peace/wellbeing  Cognitive Features That Contribute To Risk:  None    Suicide Risk:  Mild:  Suicidal ideation of limited frequency, intensity, duration, and specificity.  There are no identifiable plans, no associated intent, mild dysphoria and related symptoms, good self-control (both objective and subjective assessment), few other risk factors,  and identifiable protective factors, including available and accessible social support.   Follow-up Information     [facility] Outpatient Office - [facility] Follow up on 10/25/2022.   Why: You were have scheduled PHONE appointment with this provider at 12:00pm. This provider will contact you by phone @ [phone] Contact information: Address: [address] [place], [place] [address] Phone [phone] Fax: [phone]                Plan Of Care/Follow-up recommendations:  Activity:  As tolerated  Lewanda Rife, MD 10/18/2022, 11:41 AM

## 2022-10-18 NOTE — Progress Notes (Signed)
   10/17/22 2300  Psych Admission Type (Psych Patients Only)  Admission Status Voluntary  Psychosocial Assessment  Patient Complaints None  Eye Contact Fair  Facial Expression Flat  Affect Appropriate to circumstance  Speech Logical/coherent  Interaction Assertive  Motor Activity Slow  Appearance/Hygiene Unremarkable  Behavior Characteristics Cooperative;Appropriate to situation  Mood Pleasant  Thought Process  Coherency WDL  Content WDL  Delusions None reported or observed  Perception WDL  Hallucination None reported or observed  Judgment WDL  Confusion None  Danger to Self  Current suicidal ideation? Denies  Agreement Not to Harm Self Yes  Description of Agreement verbal  Danger to Others  Danger to Others None reported or observed

## 2022-10-18 NOTE — Group Note (Signed)
Date:  10/18/2022 Time:  11:32 AM  Group Topic/Focus:  Goals Group:   The focus of this group is to help patients establish daily goals to achieve during treatment and discuss how the patient can incorporate goal setting into their daily lives to aide in recovery.    Participation Level:  Active  Participation Quality:  Appropriate  Affect:  Appropriate  Cognitive:  Appropriate  Insight: Appropriate  Engagement in Group:  Engaged  Modes of Intervention:  Discussion  Additional Comments:     Reymundo Poll 10/18/2022, 11:32 AM

## 2022-10-18 NOTE — Progress Notes (Signed)
Pt discharged at this time. Pt removed all belongings, prescriptions, and verbalized understanding of medications and follow up care. Pt denies SI/HI/AVH. Pt left facility accompanied by family.

## 2022-10-20 ENCOUNTER — Encounter: Payer: Self-pay | Admitting: Nurse Practitioner

## 2022-10-23 NOTE — Transitions of Care (Post Inpatient/ED Visit) (Signed)
   10/23/2022  Name: [name]. MRN: [id number] DOB: 05/14/1996  Today's TOC FU Call Status: Today's TOC FU Call Status:: Unsuccessful Call (2nd Attempt) TOC FU Call Complete Date: 10/16/22  Attempted to reach the patient regarding the most recent Inpatient/ED visit.  Follow Up Plan: No further outreach attempts will be made at this time. We have been unable to contact the patient.  Signature  Arvil Persons, BSN, Charity fundraiser

## 2022-10-24 ENCOUNTER — Ambulatory Visit: Payer: Medicaid Other | Admitting: Nurse Practitioner

## 2022-10-24 ENCOUNTER — Telehealth: Payer: Self-pay | Admitting: Nurse Practitioner

## 2022-10-24 NOTE — Telephone Encounter (Signed)
Pt was a no show for an OV with Lauren on 10/24/22, I sent a letter.

## 2022-10-25 ENCOUNTER — Telehealth (INDEPENDENT_AMBULATORY_CARE_PROVIDER_SITE_OTHER): Payer: BC Managed Care – PPO | Admitting: Nurse Practitioner

## 2022-10-25 ENCOUNTER — Encounter: Payer: Self-pay | Admitting: Nurse Practitioner

## 2022-10-25 DIAGNOSIS — J4540 Moderate persistent asthma, uncomplicated: Secondary | ICD-10-CM | POA: Diagnosis not present

## 2022-10-25 DIAGNOSIS — F332 Major depressive disorder, recurrent severe without psychotic features: Secondary | ICD-10-CM

## 2022-10-25 NOTE — Assessment & Plan Note (Signed)
Chronic, improving.  He was recently hospitalized for severe depression.  His sertraline was increased to 100 mg daily and he is taking trazodone 50 mg at bedtime as needed for sleep and hydroxyzine 25 mg 3 times daily as needed anxiety.  He states that he is doing better since being discharged from the hospital, he denies suicidal ideation currently.  He is currently living with his dad and is working on getting himself together.  He is established with [facility] psychiatry and is establishing with a new PCP closer to home on June 13.  Encouraged him to continue following regularly with psychiatry and to reach out with any concerns.

## 2022-10-25 NOTE — Patient Instructions (Signed)
It was great to see you!  Please reach out if you need anything!  Take care,  Rodman Pickle, NP

## 2022-10-25 NOTE — Progress Notes (Signed)
[facility] [address] [place] [address] Dept: [phone] Dept Fax: [phone]  Virtual Video Visit  I connected with Chad Willis. on 10/25/22 at  4:20 PM EDT by a video enabled telemedicine application and verified that I am speaking with the correct person using two identifiers.  Location patient: Home Location provider: Clinic Persons participating in the virtual visit: Patient; Rodman Pickle, NP; Malena Peer, CMA  I discussed the limitations of evaluation and management by telemedicine and the availability of in person appointments. The patient expressed understanding and agreed to proceed.  Chief Complaint  Patient presents with   Depression    Follow-up after discharge from hospital   Asthma    SUBJECTIVE:  HPI: Chad Willis. is a 27 y.o. male who presents to follow-up on depression and asthma.  He states that his asthma is doing well.  He is using his Qvar inhaler and albuterol as needed.  He denies shortness of breath and chest pain.  He was recently admitted to behavioral health for exacerbation of depression.  He states that they increased his sertraline to 100 mg daily and started him on trazodone 50 mg at bedtime as needed for sleep and hydroxyzine 25 mg 3 times a day as needed for anxiety.  He states that he is doing a lot better.  He also moved in with his dad back at home in [place].  He states that since he has been discharged, he has not had any more suicidal ideation.  He is still working on getting himself together, he feels like he is doing slightly better.  He has noticed some hallucinations of people walking by and some tactile hallucinations of feeling things under his skin, however not as frequently as when he was younger.  He is established with [facility] psychiatry and establishing with a new PCP in [place] on June 13.  Patient Active Problem  List   Diagnosis Date Noted   Suicidal ideation 10/14/2022   MDD (major depressive disorder), recurrent severe, without psychosis (HCC) 10/14/2022   Anxiety and depression 09/13/2022   Asthma 01/25/2022   Grief 01/25/2022   Nausea 01/15/2022   Hematemesis 01/15/2022   Generalized abdominal pain 01/15/2022   Fatigue 01/15/2022   Chest tightness 11/26/2021   Irritable bowel syndrome with diarrhea 11/26/2021    Past Surgical History:  Procedure Laterality Date   KNEE ARTHROSCOPY WITH ANTERIOR CRUCIATE LIGAMENT (ACL) REPAIR Left    LAPAROSCOPIC APPENDECTOMY N/A 03/01/2018   Procedure: APPENDECTOMY LAPAROSCOPIC;  Surgeon: Abigail Miyamoto, MD;  Location: [facility] OR;  Service: General;  Laterality: N/A;    Family History  Problem Relation Age of Onset   Cancer Mother        breast   Diabetes Mother    Hypertension Mother    Healthy Father    Hypertension Maternal Grandmother    Diabetes Maternal Grandmother    Hypertension Maternal Grandfather    Diabetes Maternal Grandfather    Hypertension Paternal Grandmother    Diabetes Paternal Grandmother    Hypertension Paternal Grandfather    Diabetes Paternal Grandfather    Cancer Maternal Aunt        breast   Cancer Paternal Aunt        breast   Stomach cancer Neg Hx    Esophageal cancer Neg Hx    Colon cancer Neg Hx     Social History   Tobacco Use   Smoking  status: Never   Smokeless tobacco: Never  Vaping Use   Vaping Use: Never used  Substance Use Topics   Alcohol use: Yes    Comment: occasional   Drug use: Never     Current Outpatient Medications:    albuterol (VENTOLIN HFA) 108 (90 Base) MCG/ACT inhaler, Inhale 2 puffs into the lungs every 6 (six) hours as needed for wheezing or shortness of breath., Disp: 8 g, Rfl: 3   beclomethasone (QVAR REDIHALER) 40 MCG/ACT inhaler, Inhale 2 puffs into the lungs 2 (two) times daily., Disp: 1 each, Rfl: 6   hydrOXYzine (ATARAX) 25 MG tablet, Take 1 tablet (25 mg total) by mouth  3 (three) times daily as needed for anxiety., Disp: 30 tablet, Rfl: 0   sertraline (ZOLOFT) 100 MG tablet, Take 1 tablet (100 mg total) by mouth daily., Disp: 30 tablet, Rfl: 0   traZODone (DESYREL) 50 MG tablet, Take 1 tablet (50 mg total) by mouth at bedtime as needed for sleep., Disp: 30 tablet, Rfl: 0  Allergies  Allergen Reactions   Amoxicillin Hives    ROS: See pertinent positives and negatives per HPI.  OBSERVATIONS/OBJECTIVE:  VITALS per patient if applicable: There were no vitals filed for this visit. There is no height or weight on file to calculate BMI.    GENERAL: Alert and oriented. Appears well and in no acute distress.  HEENT: Atraumatic. Conjunctiva clear. No obvious abnormalities on inspection of external nose and ears.  NECK: Normal movements of the head and neck.  LUNGS: On inspection, no signs of respiratory distress. Breathing rate appears normal. No obvious gross SOB, gasping or wheezing, and no conversational dyspnea.  CV: No obvious cyanosis.  MS: Moves all visible extremities without noticeable abnormality.  PSYCH/NEURO: Pleasant and cooperative. No obvious depression or anxiety. Speech and thought processing grossly intact.  ASSESSMENT AND PLAN:  Problem List Items Addressed This Visit       Respiratory   Asthma - Primary    Chronic, stable.  Continue Qvar inhaler 2 puffs twice a day and albuterol inhaler as needed.        Other   MDD (major depressive disorder), recurrent severe, without psychosis (HCC)    Chronic, improving.  He was recently hospitalized for severe depression.  His sertraline was increased to 100 mg daily and he is taking trazodone 50 mg at bedtime as needed for sleep and hydroxyzine 25 mg 3 times daily as needed anxiety.  He states that he is doing better since being discharged from the hospital, he denies suicidal ideation currently.  He is currently living with his dad and is working on getting himself together.  He is  established with [facility] psychiatry and is establishing with a new PCP closer to home on June 13.  Encouraged him to continue following regularly with psychiatry and to reach out with any concerns.        I discussed the assessment and treatment plan with the patient. The patient was provided an opportunity to ask questions and all were answered. The patient agreed with the plan and demonstrated an understanding of the instructions.   The patient was advised to call back or seek an in-person evaluation if the symptoms worsen or if the condition fails to improve as anticipated.   Gerre Scull, NP

## 2022-10-25 NOTE — Assessment & Plan Note (Signed)
Chronic, stable.  Continue Qvar inhaler 2 puffs twice a day and albuterol inhaler as needed.

## 2022-10-28 ENCOUNTER — Encounter: Payer: Self-pay | Admitting: Nurse Practitioner

## 2022-10-28 MED ORDER — HYDROXYZINE HCL 25 MG PO TABS: 25.0000 mg | ORAL_TABLET | Freq: Three times a day (TID) | ORAL | 0 refills | Status: AC | PRN

## 2022-10-30 ENCOUNTER — Ambulatory Visit: Payer: Medicaid Other | Admitting: Nurse Practitioner

## 2022-10-31 NOTE — Telephone Encounter (Signed)
2nd no show, fee cannot be billed/Medicaid plan, final warning letter sent via mail and mychart, text sent

## 2022-11-21 ENCOUNTER — Ambulatory Visit: Payer: Medicaid Other | Admitting: Behavioral Health

## 2022-11-21 NOTE — Progress Notes (Unsigned)
                Huma Imhoff L Alek Poncedeleon, LMFT 

## 2023-01-03 ENCOUNTER — Ambulatory Visit (INDEPENDENT_AMBULATORY_CARE_PROVIDER_SITE_OTHER): Payer: Medicaid Other | Admitting: Behavioral Health

## 2023-01-03 DIAGNOSIS — F321 Major depressive disorder, single episode, moderate: Secondary | ICD-10-CM

## 2023-01-03 NOTE — Progress Notes (Signed)
                Victoria L Winstead, LMFT 

## 2023-01-03 NOTE — Progress Notes (Addendum)
 Behavioral Health Counselor/Therapist Progress Note  Patient ID: Chad Flaig., MRN: [id number],    Date: 01/03/2023  Time Spent: 55 min Caregility video; Pt is home in [place] @ his Family's home & Provider working remote from [employer]. Pt is aware of risks/limitations & consents to Tx today.  Time In: 9:00am Time Out: 9:55am   Treatment Type: Individual Therapy  Reported Symptoms: Pt is post [facility] hosp'tzn since 10/15/2022. The hosp'tzn consisted of ~5 days. Pt was SI/HI towards self & friend Chad Willis who is carrying his Baby @ 6 mos gestation now.   Mental Status Exam: Appearance:  Casual     Behavior: Appropriate and Sharing  Motor: Normal  Speech/Language:  Slow  Affect: Appropriate  Mood: normal  Thought process: normal  Thought content:   WNL; neg for hallucinations today w/initiation of Caplyta 42mg  2 days ago from [facility] appt w/Provider Chad Willis, AGNP  Sensory/Perceptual disturbances:   Hallucinations: Visual Reduced since taking Caplyta for  2 days now  Orientation: oriented to person, place, time/date, and situation  Attention: Good  Concentration: Good  Memory: WNL  Fund of knowledge:  Good  Insight:   Fair  Judgment:  Fair  Impulse Control: Good and Fair   Risk Assessment: Danger to Self:  No Self-injurious Behavior: No Danger to Others: No Duty to Warn:no Physical Aggression / Violence:No  Access to Firearms a concern: No  Gang Involvement:No   Subjective: Pt speech is slowed today. He reports feeling better since hospitalization in May. This is our first visit since that time. He is living @ his FOO home in [place] & requests a Letter of Support be sent to his Leasing Agent: [employer] [address] [place], [place] [address] He has to break his lease & owes one more pymt. Instructed Pt for need to sign a ROI for the Letter & a name of the person who should receive it. Pt acknowledged & agreed. Did not reassure Pt this would help, but  Clinician will do her best.   Pt placed on Caplyta 42mg  by Chad Willis from [facility]. His hallucinations have subsided & he is sleeping well. He is seeking a job in [place] since he cannot live in [place] any longer. He has ceased going to Sch @ [employer]. He is talking regularly to his Baby's Mother Chad Willis.   Interventions: Cognitive Behavioral Therapy and update from recent hospitalization in May 2024 in which Pt became overwhelmed, anxious, & active SI/HI  Diagnosis:Current moderate episode of major depressive disorder without prior episode [facility])  Plan: Chad Willis is glad to speak today & will f/u with his Psychiatric appt on 01/08/2023. He will record in a Notebook btwn sessions now to keep his thoughts focused.  Target Date: 01/24/2023  Progress: 4  Frequency: Once every 2 wks  Modality: Chad Fraise, LMFT

## 2023-01-17 ENCOUNTER — Ambulatory Visit (INDEPENDENT_AMBULATORY_CARE_PROVIDER_SITE_OTHER): Payer: Medicaid Other | Admitting: Behavioral Health

## 2023-01-17 DIAGNOSIS — F321 Major depressive disorder, single episode, moderate: Secondary | ICD-10-CM | POA: Diagnosis not present

## 2023-01-17 NOTE — Progress Notes (Signed)
[facility] Counselor/Therapist Progress Note  Patient ID: Chad Schulze., MRN: [id number],    Date: 01/17/2023  Time Spent: 55 min Caregility video; Pt is home @ his Fr's in private & Provider working remotely from [employer]. Pt is aware of risks/limitations of telehealth & consents to tTx today.  Treatment Type: Individual Therapy  Reported Symptoms: Elevated anx/dep & stress due to mental health status changes in the past few months  Mental Status Exam: Appearance:  Casual     Behavior: Appropriate and Sharing  Motor: Normal  Speech/Language:  Clear and Coherent  Affect: Appropriate  Mood: normal  Thought process: Improved lately w/medications  Thought content:   WNL  Sensory/Perceptual disturbances:   WNL  Orientation: oriented to person, place, time/date, and situation  Attention: Good  Concentration: Good  Memory: WNL  Fund of knowledge:  Good  Insight:   Good  Judgment:  Good  Impulse Control: Good   Risk Assessment: Danger to Self:  No Self-injurious Behavior: No Danger to Others: No Duty to Warn:no Physical Aggression / Violence: No Access to Firearms a concern: No  Gang Involvement:No   Subjective: Pt is getting his life back on track. He is keeping up w/his Sig Other who is preg @ 28 wks now. He is taking a 3 Course load that starts this week. He is keeping his Master's Degree congruent w/his goals @ [employer].  Pt is feeling better. He is nervous about settling back into his life Plan.   His Sig Other is 28 wks & has Pre-Eclampsia & may have to be delivered early. This makes him addt'ly nervous. They are communicating daily.  Interventions: Family Systems  Diagnosis:Current moderate episode of major depressive disorder without prior episode (HCC)  Plan: Chad Willis is speaking to his Baby's Mother daily due to Hx of complications around a Birth Plan. The Cpl has named the [employer]. They have tried to prepare for him  & his birth. Jaber will cont his visits w/Clinician to maintain his progress since his last visit.   Target Date: 02/24/2023  Progress: 5  Frequency: Once every 3-4 wks  Modality: Claretta Fraise, LMFT

## 2023-01-17 NOTE — Progress Notes (Signed)
                Victoria L Winstead, LMFT 

## 2023-02-17 ENCOUNTER — Ambulatory Visit: Payer: Medicaid Other | Admitting: Behavioral Health
